# Patient Record
Sex: Male | Born: 1950 | Race: White | Hispanic: No | Marital: Married | Smoking: Never smoker
Health system: Southern US, Community
[De-identification: ages and names within clinical notes are randomized; demographics above are authoritative.]

## PROBLEM LIST (undated history)

## (undated) DIAGNOSIS — I2510 Atherosclerotic heart disease of native coronary artery without angina pectoris: Secondary | ICD-10-CM

## (undated) DIAGNOSIS — M1990 Unspecified osteoarthritis, unspecified site: Secondary | ICD-10-CM

## (undated) DIAGNOSIS — E785 Hyperlipidemia, unspecified: Secondary | ICD-10-CM

## (undated) DIAGNOSIS — I509 Heart failure, unspecified: Secondary | ICD-10-CM

## (undated) DIAGNOSIS — C4491 Basal cell carcinoma of skin, unspecified: Secondary | ICD-10-CM

## (undated) DIAGNOSIS — H9190 Unspecified hearing loss, unspecified ear: Secondary | ICD-10-CM

## (undated) DIAGNOSIS — Z87442 Personal history of urinary calculi: Secondary | ICD-10-CM

## (undated) DIAGNOSIS — I10 Essential (primary) hypertension: Secondary | ICD-10-CM

## (undated) DIAGNOSIS — J309 Allergic rhinitis, unspecified: Secondary | ICD-10-CM

## (undated) HISTORY — PX: COLONOSCOPY: SHX174

## (undated) HISTORY — DX: Heart failure, unspecified: I50.9

## (undated) HISTORY — PX: CARDIAC CATHETERIZATION: SHX172

## (undated) HISTORY — DX: Unspecified hearing loss, unspecified ear: H91.90

## (undated) HISTORY — PX: NASAL SEPTUM SURGERY: SHX37

## (undated) HISTORY — PX: VASECTOMY: SHX75

## (undated) HISTORY — DX: Allergic rhinitis, unspecified: J30.9

## (undated) HISTORY — DX: Basal cell carcinoma of skin, unspecified: C44.91

---

## 1998-07-24 DIAGNOSIS — C4491 Basal cell carcinoma of skin, unspecified: Secondary | ICD-10-CM

## 1998-07-24 HISTORY — DX: Basal cell carcinoma of skin, unspecified: C44.91

## 2004-03-04 ENCOUNTER — Emergency Department (HOSPITAL_COMMUNITY): Admission: EM | Admit: 2004-03-04 | Discharge: 2004-03-05 | Payer: Self-pay | Admitting: Emergency Medicine

## 2004-03-06 ENCOUNTER — Emergency Department (HOSPITAL_COMMUNITY): Admission: EM | Admit: 2004-03-06 | Discharge: 2004-03-06 | Payer: Self-pay | Admitting: Emergency Medicine

## 2006-05-23 IMAGING — CT CT ABDOMEN W/O CM
1 series · 16 of 32 positions shown, 20 images · non-contrast
Comparison: none

CLINICAL DATA: Left lower abdominal pain.  Can?t urinate.
 CT ABDOMEN WITHOUT CONTRAST AND CT PELVIS WITHOUT CONTRAST ? 03/04/04
TECHNIQUE: 5mm collimated images were obtained without oral or intravenous contrast.  
 CT ABDOMEN:
 Unenhanced appearance of the liver, spleen, pancreas, adrenal glands, and gallbladder, as well as visualized portions of the small and large bowel all appear unremarkable.  
 The right kidney is normal without stones, hydronephrosis, or obvious mass.  The left kidney is moderately hydronephrotic as is the left ureter.  No aortic or inferior vena cava abnormalities are seen.  There is no obvious adenopathy.
 IMPRESSION
 Obstructive uropathy of left renal system.
 CT PELVIS:
 Vascular calcification is noted.  Left hydroureter is again noted.  There is a 2 mm calculus lodged at the left ureterovesical junction.  A Foley catheter decompresses the bladder.  Small and large bowel are unremarkable.  There is no free fluid in the pelvis.
 Obstructive uropathy secondary to a 2 mm left ureterovesical junction stone.

[Series 3: — · axial · 0.71mm/px · z∈[-413,-81]mm · 16 of 92 slices shown, 20 images]
[im 6/92  soft-tissue]
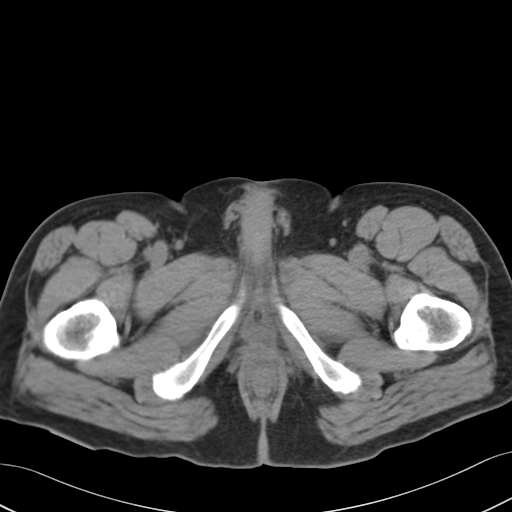
[im 6/92  bone]
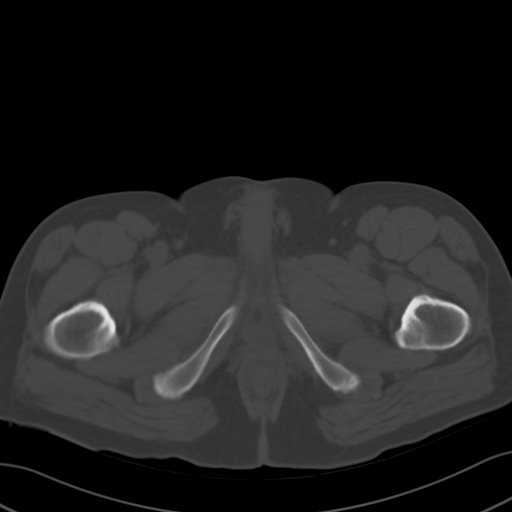
[im 12/92  soft-tissue]
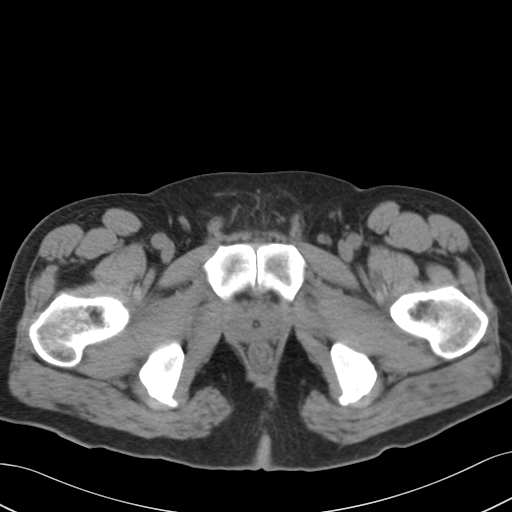
[im 18/92  soft-tissue]
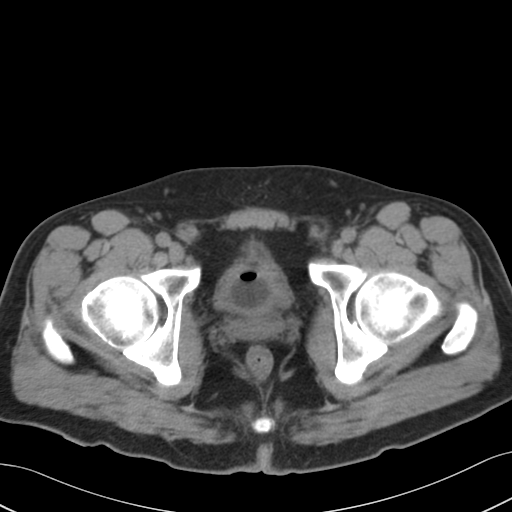
[im 24/92  soft-tissue]
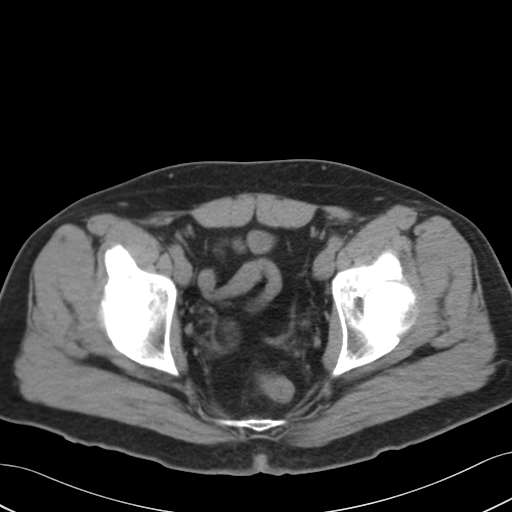
[im 30/92  soft-tissue]
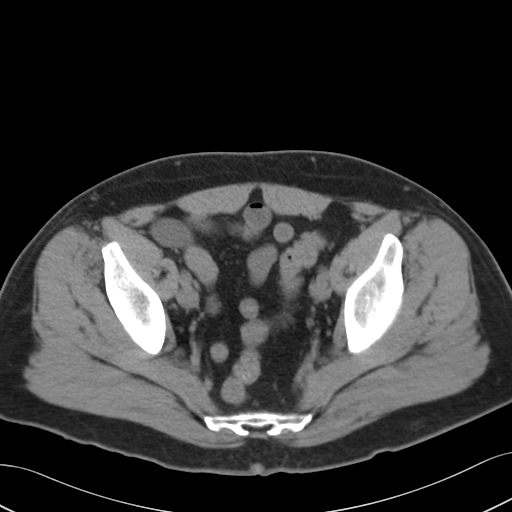
[im 36/92  soft-tissue]
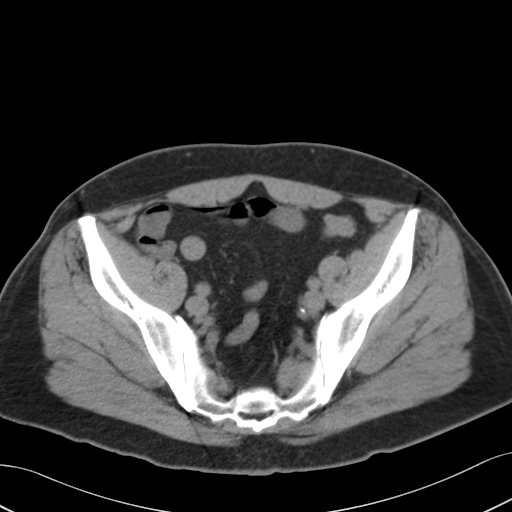
[im 42/92  soft-tissue]
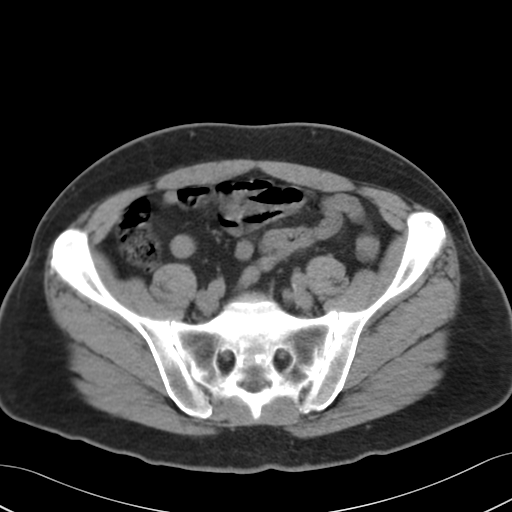
[im 50/92  soft-tissue]
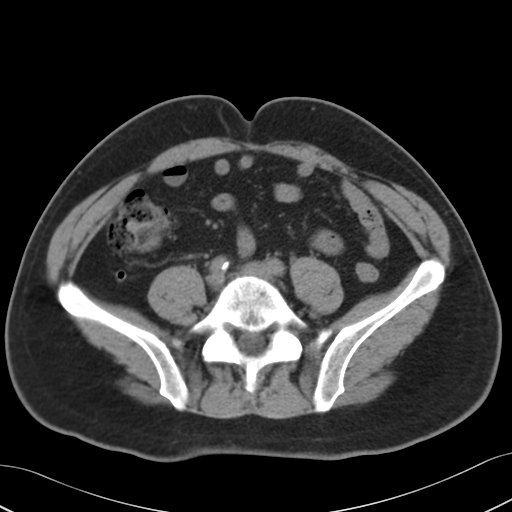
[im 56/92  soft-tissue]
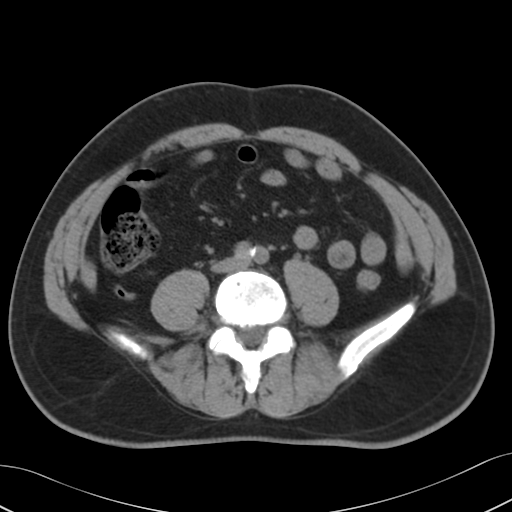
[im 56/92  bone]
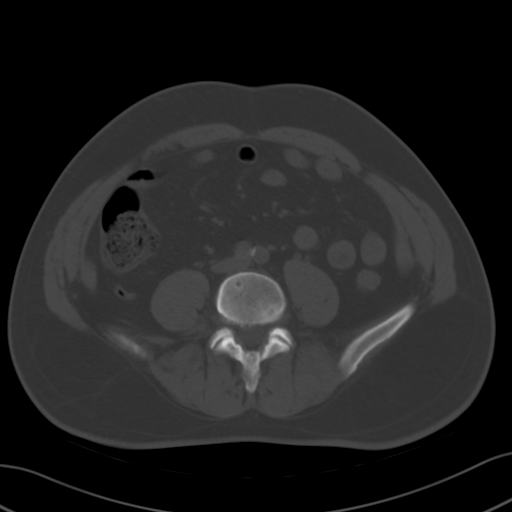
[im 62/92  soft-tissue]
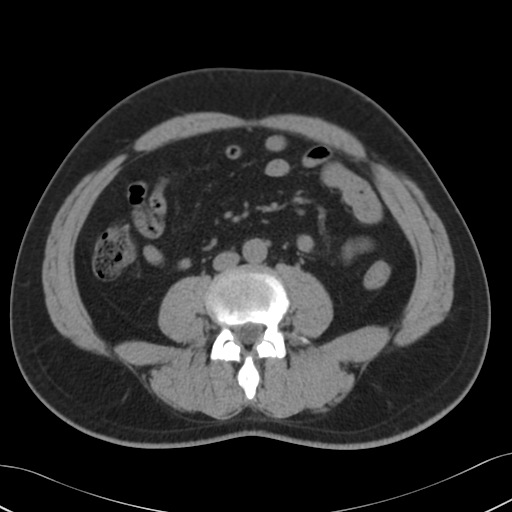
[im 68/92  soft-tissue]
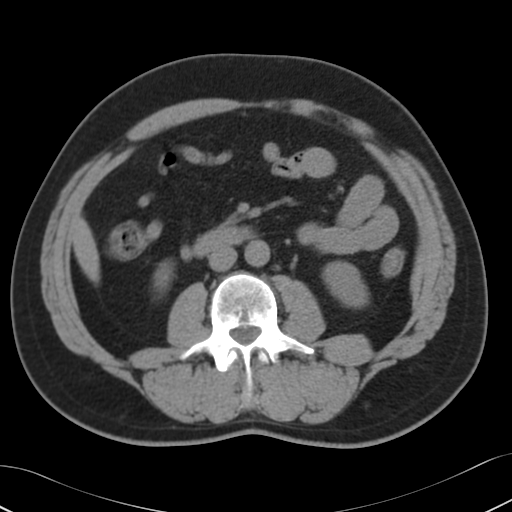
[im 74/92  soft-tissue]
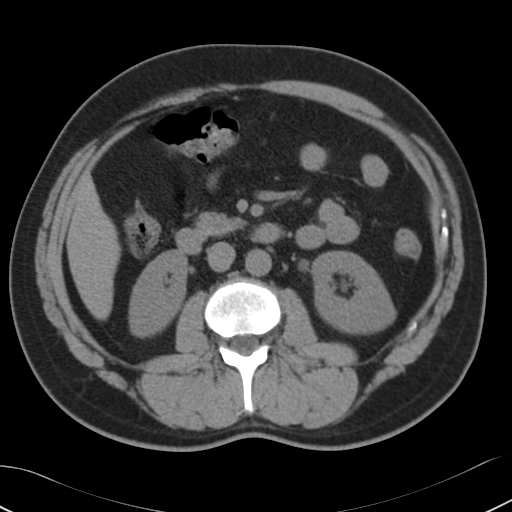
[im 80/92  soft-tissue]
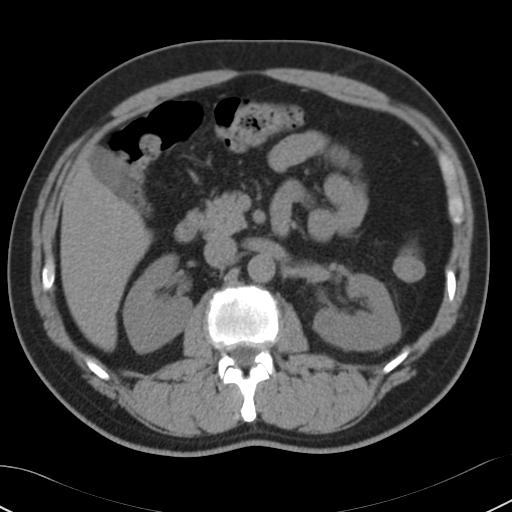
[im 80/92  lung]
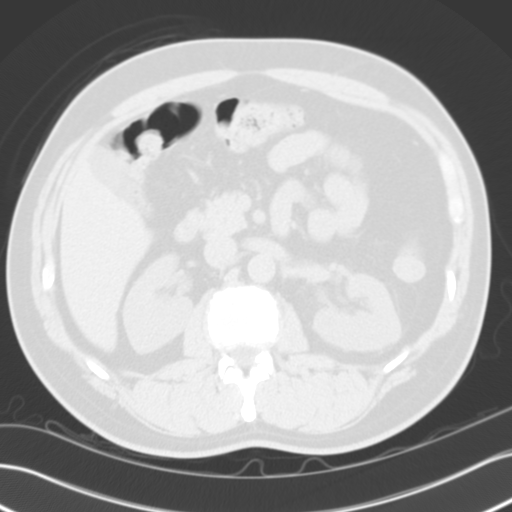
[im 83/92  lung]
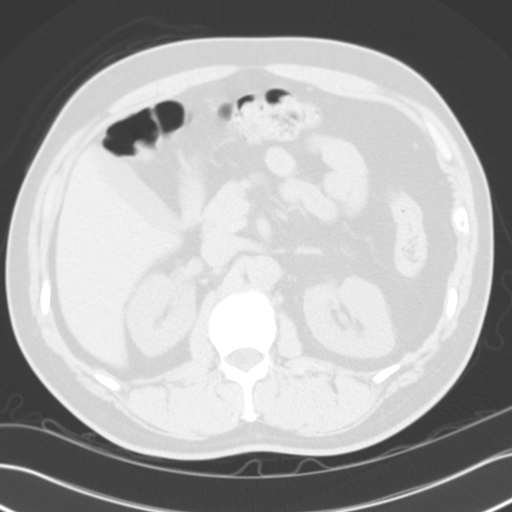
[im 86/92  soft-tissue]
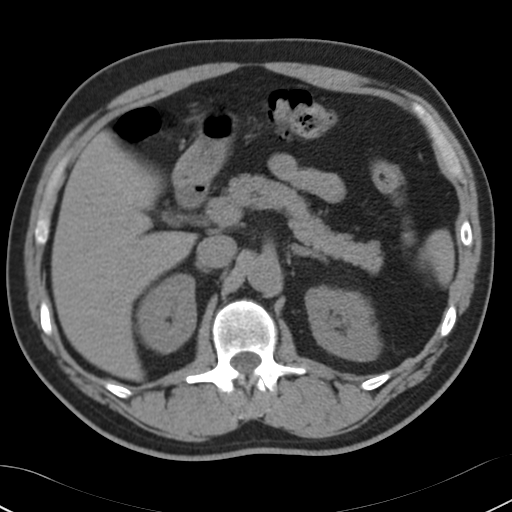
[im 86/92  lung]
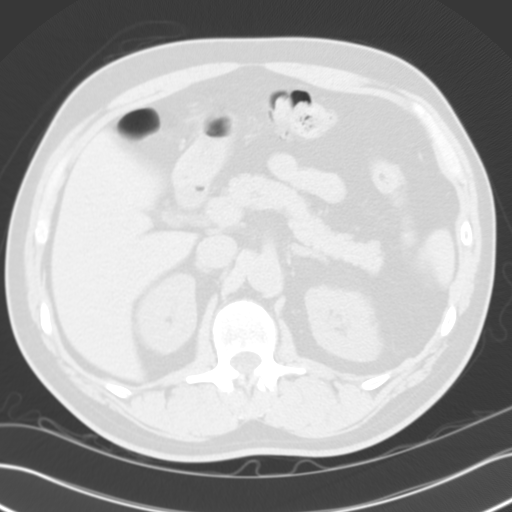
[im 89/92  lung]
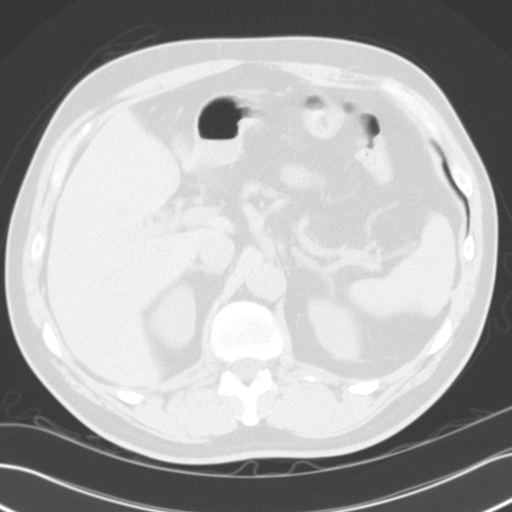

[16 of 32 positions shown; findings below may reference images not displayed]

## 2014-01-14 ENCOUNTER — Ambulatory Visit
Admission: RE | Admit: 2014-01-14 | Discharge: 2014-01-14 | Disposition: A | Discharge status: Home / Self Care | Payer: BC Managed Care – PPO | Source: Ambulatory Visit | Attending: Physician Assistant | Admitting: Physician Assistant

## 2014-01-14 ENCOUNTER — Other Ambulatory Visit: Payer: Self-pay | Admitting: Physician Assistant

## 2014-01-14 DIAGNOSIS — R079 Chest pain, unspecified: Secondary | ICD-10-CM

## 2014-01-21 ENCOUNTER — Encounter (HOSPITAL_COMMUNITY): Payer: Self-pay | Admitting: Pharmacy Technician

## 2014-01-21 ENCOUNTER — Other Ambulatory Visit: Payer: Self-pay | Admitting: Cardiology

## 2014-01-21 NOTE — H&P (Signed)
Alex Herrera    Date of visit:  01/16/2014 DOB:  [birth year]    Age:  63 yrs. Medical record number:  [id number]     Account number:  [id number] Primary Care Provider: Maury Dus ____________________________ CURRENT DIAGNOSES  1. Chest Pain  2. Hyperlipidemia  3. Hypertension,Essential (Benign)  4. Obesity(BMI30-40) ____________________________ ALLERGIES  Amlodipine, Edema  Penicillins, Intolerance-unknown ____________________________ MEDICATIONS  1. valacyclovir 1 gram tablet, 1 p.o. daily  2. Edarbi 80 mg tablet, 1 p.o. daily  3. hydrochlorothiazide 25 mg tablet, 1/2 tab daily  4. pravastatin 40 mg tablet, 1 p.o. daily  5. aspirin 81 mg chewable tablet, 1 p.o. daily  6. Flonase Allergy Relief 50 mcg/actuation nasal spray,suspension, qd  7. Benadryl 25 mg capsule, PRN  8. Aleve PM 220 mg-25 mg tablet, QHS  9. metoprolol succinate ER 25 mg tablet,extended release 24 hr, 1 p.o. daily  10. nitroglycerin 0.4 mg sublingual tablet, PRN ____________________________ CHIEF COMPLAINTS  Chest pain with exertion ____________________________ HISTORY OF PRESENT ILLNESS  This very nice 63 year old male is evaluated for the onset of exertional chest tightness. He has a prior history of hypertension as well as hyperlipidemia and mild obesity. He went on a trip to [place] and then after coming back on developed some substernal pressure and tightness when he attempted to walk on a treadmill. He has had midsternal tightness and heaviness with activity since then on several occasions and had an episode earlier in the week where he had similar discomfort when attempting sexual relations with his wife. He was concerned and saw his primary physician and was seen today for an exercise treadmill. He had an abnormal treadmill test at Bruce stage III with 1 mm of ST depression in the anterior leads with T wave changes noted in recovery. He has not had any rest angina. He denies PND, orthopnea or  edema ____________________________ PAST HISTORY  Past Medical Illnesses:  hypertension, hyperlipidemia, kidney stones, carpal tunnel syndrome;  Cardiovascular Illnesses:  no previous history of cardiac disease.;  Surgical Procedures:  nose surg;  Cardiology Procedures-Invasive:  no history of prior cardiac procedures;  Cardiology Procedures-Noninvasive:  treadmill June 2015;  LVEF not documented,   ____________________________ CARDIO-PULMONARY TEST DATES EKG Date:  01/16/2014;   ____________________________ FAMILY HISTORY Brother -- Brother alive with problem Brother -- Brother dead, AIDS Father -- Father dead, Hypertension, Chronic obstructive lung disease Mother -- Mother dead, Coronary artery bypass grafting, Diabetes mellitus, Dementia/Alzheimers Sister -- Sister alive with problem, Hypertension ____________________________ SOCIAL HISTORY Alcohol Use:  rarely;  Smoking:  never smoked;  Diet:  regular diet;  Lifestyle:  married;  Exercise:  walking 2 days per week;  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 Eyes: denies diplopia, history of glaucoma or visual problems. Ears, Nose, Throat, Mouth:  denies any hearing loss, epistaxis, hoarseness or difficulty speaking. Respiratory: denies dyspnea, cough, wheezing or hemoptysis. Cardiovascular:  please review HPI Abdominal: denies dyspepsia, GI bleeding, constipation, or diarrhea Genitourinary-Male: no dysuria, urgency, frequency, or nocturia  Musculoskeletal:  denies arthritis, venous insufficiency, or muscle weakness Neurological:  occasional migraine headaches Psychiatric:  denies depession or anxiety  ____________________________ PHYSICAL EXAMINATION VITAL SIGNS  Blood Pressure:  120/70 Sitting, Left arm, regular cuff   Pulse:  68/min. Weight:  202.00 lbs. Height:  70"BMI:  29  Constitutional:  pleasant white male in no acute distress, mildly obese  Skin:  warm and dry to touch, no apparent skin lesions, or masses noted. Head:  normocephalic, normal hair pattern, no masses or tenderness Eyes:  EOMS Intact, PERRLA, C and S clear, Funduscopic exam not done. ENT:  ears, nose and throat reveal no gross abnormalities.  Dentition good. Neck:  supple, without massess. No JVD, thyromegaly or carotid bruits. Carotid upstroke normal. Chest:  normal symmetry, clear to auscultation. Cardiac:  regular rhythm, normal S1 and S2, No S3 or S4, no murmurs, gallops or rubs detected. Abdomen:  abdomen soft,non-tender, no masses, no hepatospenomegaly, or aneurysm noted Peripheral Pulses:  the femoral,and posterior tibial pulses are full and equal bilaterally with no bruits auscultated.dorsalis pedis diminished. Extremities & Back:  no deformities, clubbing, cyanosis, erythema or edema observed. Normal muscle strength and tone. Neurological:  no gross motor or sensory deficits noted, affect appropriate, oriented x3. ____________________________ IMPRESSIONS/PLAN 1. Exertional chest discomfort suggestive of new onset angina with an abnormal exercise test 2. Hypertension controlled 3. Hyperlipidemia under treatment 4. Overweight  Recommendations:  Plan cardiac catheterization to assess for extent of coronary artery disease.Cardiac catheterization was discussed with the patient including risks of myocardial infarction, death, stroke, bleeding, arrhythmia, dye allergy, or renal insufficiency. He understands and is willing to proceed. Possibility of percutaneous intervention at the same setting was also discussed with the patient including risks. Begun on metoprolol and given NTG prn. ____________________________ TODAYS ORDERS  1. Comprehensive Metabolic Panel: Today  2. Complete Blood Count: Today  3. Draw PT/INR: Today  4. PTT: Today  5. Left Heart Cath/Poss PCI: 1 week                        ____________________________ Cardiology Physician:  Kerry Hough MD [facility]

## 2014-01-22 ENCOUNTER — Encounter (HOSPITAL_COMMUNITY): Payer: Self-pay | Admitting: General Practice

## 2014-01-22 ENCOUNTER — Encounter (HOSPITAL_COMMUNITY)
Admission: RE | Disposition: A | Discharge status: Home / Self Care | Payer: BC Managed Care – PPO | Source: Ambulatory Visit | Attending: Cardiology

## 2014-01-22 ENCOUNTER — Ambulatory Visit (HOSPITAL_COMMUNITY)
Admission: RE | Admit: 2014-01-22 | Discharge: 2014-01-23 | Disposition: A | Discharge status: Home / Self Care | Payer: BC Managed Care – PPO | Source: Ambulatory Visit | Attending: Cardiology | Admitting: Cardiology

## 2014-01-22 DIAGNOSIS — R9439 Abnormal result of other cardiovascular function study: Secondary | ICD-10-CM | POA: Diagnosis present

## 2014-01-22 DIAGNOSIS — E669 Obesity, unspecified: Secondary | ICD-10-CM | POA: Insufficient documentation

## 2014-01-22 DIAGNOSIS — I2510 Atherosclerotic heart disease of native coronary artery without angina pectoris: Secondary | ICD-10-CM

## 2014-01-22 DIAGNOSIS — I10 Essential (primary) hypertension: Secondary | ICD-10-CM | POA: Insufficient documentation

## 2014-01-22 DIAGNOSIS — I25119 Atherosclerotic heart disease of native coronary artery with unspecified angina pectoris: Secondary | ICD-10-CM

## 2014-01-22 DIAGNOSIS — Z955 Presence of coronary angioplasty implant and graft: Secondary | ICD-10-CM

## 2014-01-22 DIAGNOSIS — I2102 ST elevation (STEMI) myocardial infarction involving left anterior descending coronary artery: Secondary | ICD-10-CM | POA: Diagnosis present

## 2014-01-22 DIAGNOSIS — I252 Old myocardial infarction: Secondary | ICD-10-CM | POA: Diagnosis present

## 2014-01-22 DIAGNOSIS — Z6829 Body mass index (BMI) 29.0-29.9, adult: Secondary | ICD-10-CM | POA: Insufficient documentation

## 2014-01-22 DIAGNOSIS — Z9861 Coronary angioplasty status: Secondary | ICD-10-CM

## 2014-01-22 DIAGNOSIS — Z7982 Long term (current) use of aspirin: Secondary | ICD-10-CM | POA: Insufficient documentation

## 2014-01-22 DIAGNOSIS — I200 Unstable angina: Secondary | ICD-10-CM | POA: Insufficient documentation

## 2014-01-22 DIAGNOSIS — E785 Hyperlipidemia, unspecified: Secondary | ICD-10-CM | POA: Insufficient documentation

## 2014-01-22 HISTORY — PX: PTCA: SHX146

## 2014-01-22 HISTORY — DX: Atherosclerotic heart disease of native coronary artery without angina pectoris: I25.10

## 2014-01-22 HISTORY — DX: Unspecified osteoarthritis, unspecified site: M19.90

## 2014-01-22 HISTORY — PX: LEFT HEART CATHETERIZATION WITH CORONARY ANGIOGRAM: SHX5451

## 2014-01-22 HISTORY — DX: Personal history of urinary calculi: Z87.442

## 2014-01-22 HISTORY — DX: Essential (primary) hypertension: I10

## 2014-01-22 HISTORY — DX: Hyperlipidemia, unspecified: E78.5

## 2014-01-22 HISTORY — PX: PERCUTANEOUS CORONARY STENT INTERVENTION (PCI-S): SHX5485

## 2014-01-22 LAB — POCT ACTIVATED CLOTTING TIME
ACTIVATED CLOTTING TIME: 146 s
ACTIVATED CLOTTING TIME: 168 s
ACTIVATED CLOTTING TIME: 146 s
Activated Clotting Time: 439 seconds

## 2014-01-22 SURGERY — LEFT HEART CATHETERIZATION WITH CORONARY ANGIOGRAM
Anesthesia: LOCAL

## 2014-01-22 MED ORDER — SODIUM CHLORIDE 0.9 % IJ SOLN: 3.0000 mL | INTRAMUSCULAR | Status: DC | PRN

## 2014-01-22 MED ORDER — CLOPIDOGREL BISULFATE 300 MG PO TABS
ORAL_TABLET | ORAL | Status: AC
  Filled 2014-01-22: qty 2

## 2014-01-22 MED ORDER — MIDAZOLAM HCL 2 MG/2ML IJ SOLN
INTRAMUSCULAR | Status: AC
  Filled 2014-01-22: qty 2

## 2014-01-22 MED ORDER — SODIUM CHLORIDE 0.9 % IV SOLN: INTRAVENOUS | Status: AC

## 2014-01-22 MED ORDER — SIMVASTATIN 20 MG PO TABS
20.0000 mg | ORAL_TABLET | Freq: Every day | ORAL | Status: DC
  Filled 2014-01-22: qty 1

## 2014-01-22 MED ORDER — HEPARIN (PORCINE) IN NACL 2-0.9 UNIT/ML-% IJ SOLN
INTRAMUSCULAR | Status: AC
  Filled 2014-01-22: qty 500

## 2014-01-22 MED ORDER — CLOPIDOGREL BISULFATE 75 MG PO TABS
75.0000 mg | ORAL_TABLET | Freq: Every day | ORAL | Status: DC
  Administered 2014-01-23: 75 mg via ORAL
  Filled 2014-01-22: qty 1

## 2014-01-22 MED ORDER — FLUTICASONE PROPIONATE 50 MCG/ACT NA SUSP: 2.0000 | Freq: Every day | NASAL | Status: DC | PRN

## 2014-01-22 MED ORDER — ASPIRIN 81 MG PO CHEW
CHEWABLE_TABLET | ORAL | Status: AC
  Administered 2014-01-22: 81 mg via ORAL
  Filled 2014-01-22: qty 1

## 2014-01-22 MED ORDER — ASPIRIN EC 81 MG PO TBEC
81.0000 mg | DELAYED_RELEASE_TABLET | Freq: Every morning | ORAL | Status: DC
  Administered 2014-01-23: 81 mg via ORAL
  Filled 2014-01-22: qty 1

## 2014-01-22 MED ORDER — SODIUM CHLORIDE 0.9 % IJ SOLN: 3.0000 mL | Freq: Two times a day (BID) | INTRAMUSCULAR | Status: DC

## 2014-01-22 MED ORDER — BIVALIRUDIN 250 MG IV SOLR
INTRAVENOUS | Status: AC
  Filled 2014-01-22: qty 250

## 2014-01-22 MED ORDER — LIDOCAINE HCL (PF) 1 % IJ SOLN
INTRAMUSCULAR | Status: AC
  Filled 2014-01-22: qty 30

## 2014-01-22 MED ORDER — FENTANYL CITRATE 0.05 MG/ML IJ SOLN
INTRAMUSCULAR | Status: AC
  Filled 2014-01-22: qty 2

## 2014-01-22 MED ORDER — ACETAMINOPHEN 325 MG PO TABS: 650.0000 mg | ORAL_TABLET | ORAL | Status: DC | PRN

## 2014-01-22 MED ORDER — HYDROCHLOROTHIAZIDE 25 MG PO TABS
12.5000 mg | ORAL_TABLET | Freq: Every morning | ORAL | Status: DC
  Filled 2014-01-22: qty 0.5

## 2014-01-22 MED ORDER — ASPIRIN 81 MG PO CHEW
CHEWABLE_TABLET | ORAL | Status: AC
  Filled 2014-01-22: qty 3

## 2014-01-22 MED ORDER — SODIUM CHLORIDE 0.9 % IV SOLN: 250.0000 mL | INTRAVENOUS | Status: DC | PRN

## 2014-01-22 MED ORDER — IRBESARTAN 300 MG PO TABS
300.0000 mg | ORAL_TABLET | Freq: Every day | ORAL | Status: DC
  Administered 2014-01-23: 09:00:00 300 mg via ORAL
  Filled 2014-01-22: qty 1

## 2014-01-22 MED ORDER — ONDANSETRON HCL 4 MG/2ML IJ SOLN: 4.0000 mg | Freq: Four times a day (QID) | INTRAMUSCULAR | Status: DC | PRN

## 2014-01-22 MED ORDER — METOPROLOL SUCCINATE ER 25 MG PO TB24
25.0000 mg | ORAL_TABLET | Freq: Every morning | ORAL | Status: DC
  Administered 2014-01-23: 09:00:00 25 mg via ORAL
  Filled 2014-01-22: qty 1

## 2014-01-22 MED ORDER — ASPIRIN 81 MG PO CHEW
81.0000 mg | CHEWABLE_TABLET | ORAL | Status: AC
  Administered 2014-01-22: 81 mg via ORAL

## 2014-01-22 MED ORDER — ADULT MULTIVITAMIN W/MINERALS CH
1.0000 | ORAL_TABLET | Freq: Every day | ORAL | Status: DC
  Administered 2014-01-23: 09:00:00 1 via ORAL
  Filled 2014-01-22: qty 1

## 2014-01-22 MED ORDER — HEPARIN (PORCINE) IN NACL 2-0.9 UNIT/ML-% IJ SOLN
INTRAMUSCULAR | Status: AC
  Filled 2014-01-22: qty 1000

## 2014-01-22 MED ORDER — VALACYCLOVIR HCL 500 MG PO TABS
1000.0000 mg | ORAL_TABLET | Freq: Every morning | ORAL | Status: DC
  Administered 2014-01-23: 09:00:00 1000 mg via ORAL
  Filled 2014-01-22: qty 2

## 2014-01-22 MED ORDER — CLOPIDOGREL BISULFATE 75 MG PO TABS: 75.0000 mg | ORAL_TABLET | Freq: Every day | ORAL | Status: DC

## 2014-01-22 MED ORDER — SODIUM CHLORIDE 0.9 % IV SOLN: INTRAVENOUS | Status: DC

## 2014-01-22 MED ORDER — ROSUVASTATIN CALCIUM 20 MG PO TABS
20.0000 mg | ORAL_TABLET | Freq: Every day | ORAL | Status: DC
Start: 2014-01-22 — End: 2014-01-23

## 2014-01-22 NOTE — CV Procedure (Signed)
Cardiac Catheterization Report   Milen Lengacher    63 y.o.  male  DOB: 12-06-1950  MRN: [id number]  01/22/2014    PROCEDURE:  Left heart catheterization with selective coronary angiography, left ventriculogram.  INDICATIONS:  Chest pain with an abnormal exercise test The risks, benefits, and details of the procedure were explained to the patient.  The patient verbalized understanding and wanted to proceed.  Informed written consent was obtained.  PROCEDURE TECHNIQUE:  After Xylocaine anesthesia a 62F sheath was placed in the right femoral artery with a single anterior needle wall stick.   Left coronary angiography was done using a Judkins L4 guide catheter.  Right coronary angiography was done using a Judkins R4 guide catheter.  A 30 cc ventriculogram was performed in the 30 degree RAO projection.  Tolerated the procedure well.  The patient was left on the table in the films were reviewed with interventional cardiologist who will plan PCI of the right coronary artery.   CONTRAST:  Total of 60 cc.  COMPLICATIONS:  None.    HEMODYNAMICS: Aortic postcontrast 155/8, left ventricle postcontrast 155/4016.  There was no gradient between the left ventricle and aorta.    ANGIOGRAPHIC DATA:    CORONARY ARTERIES:   Arise and distribute normally.  Codominant. Calcification is seen involving the left anterior descending proximally and midportion.  Left main coronary artery: Normal.  Left anterior descending: Calcified proximally, mild irregularity proximally with a 40% stenosis prior to the septal perforator Diagonal branch has a 30% ostial stenosis. The coronaries are somewhat small in caliber.  Circumflex coronary artery: Mild irregularity  Right coronary artery: Severe 99% proximal stenosis , the artery ends in a posterior descending artery.   LEFT VENTRICULOGRAM:  Performed in the 30 RAO projection.  The aortic and mitral valves are normal. The left  ventricle is normal in size with normal wall motion. Estimated ejection fraction is  50%.   IMPRESSIONS:  1. Coronary artery disease with a severe proximal stenosis involving the right coronary artery, moderate stenosis involving the LAD  2. Llower limits of normal left ventricular function   RECOMMENDATION:   Dr. Julianne Handler will evaluate the patient for consideration of coronary stenting. The patient will be left on the table.    Kerry Hough MD [facility]

## 2014-01-22 NOTE — Care Management Note (Addendum)
  Page 1 of 1   01/22/2014     12:41:59 PM CARE MANAGEMENT NOTE 01/22/2014  Patient:  Alex Herrera, Alex Herrera   Account Number:  [id number]  Date Initiated:  01/22/2014  Documentation initiated by:  Mariann Laster  Subjective/Objective Assessment:   chest pain  Left Heart Cath 01/22/2014     Action/Plan:   CM to follow for dispositon needs   Anticipated DC Date:  01/23/2014   Anticipated DC Plan:  HOME/SELF CARE         Choice offered to / List presented to:             Status of service:  Completed, signed off Medicare Important Message given?   (If response is "NO", the following Medicare IM given date fields will be blank) Date Medicare IM given:   Medicare IM given by:   Date Additional Medicare IM given:   Additional Medicare IM given by:    Discharge Disposition:  HOME/SELF CARE  Per UR Regulation:    If discussed at Long Length of Stay Meetings, dates discussed:    Comments:  Jaman Aro RN, BSN, MSHL, CCM  Nurse - Alex Herrera Manager, (Unit [phone]  01/22/2014 Med Review:  Plavix

## 2014-01-22 NOTE — Progress Notes (Signed)
Site area: right groin  Site Prior to Removal:  Level 0  Pressure Applied For 20 MINUTES    Minutes Beginning at 1155  Manual:   Yes.    Patient Status During Pull:  stable  Post Pull Groin Site:  Level 0  Post Pull Instructions Given:  Yes.    Post Pull Pulses Present:  Yes.    Dressing Applied:  Yes.    Comments:   

## 2014-01-22 NOTE — Interval H&P Note (Signed)
History and Physical Interval Note:  01/22/2014 7:41 AM     Patient seen and examined.  No interval change in history and exam since last note.  Stable for procedure.  Kerry Hough. MD [facility]  01/22/2014

## 2014-01-22 NOTE — Discharge Summary (Signed)
Physician Discharge Summary  Patient ID: Alex Herrera MRN: [id number] DOB/AGE: May 16, 1951 63 y.o.  Admit date: 01/22/2014 Discharge date: 01/22/2014  Primary Physician:  Dr. Maury Dus  Primary Discharge Diagnosis:  1. Coronary artery disease with recent onset angina  Secondary Discharge Diagnosis: 2. Hypertension 3. Obesity 4. Hyperlipidemia  Procedures:  Cardiac catheterization, percutaneous intervention with drug-eluting stent placement to the right coronary artery  Hospital Course: This 63 year old male had the onset of angina about 3-4 weeks prior to admission. The discomfort occurred with exercise as well as sexual intercourse and he was seen at the office for he had an abnormal exercise treadmill test into Bruce stage III with 2 mm of ST depression in the lateral leads. Was started on metoprolol and aspirin and given nitroglycerin to use.  He was brought in for same-day cardiac catheterization which showed lower limits of normal left ventricular function with an EF of 50%. There is calcification in the LAD with a 40% proximal LAD stenosis and some diffuse disease in the diagonal and LAD, irregularities the circumflex, and a 99% segmental stenosis in the proximal right coronary artery. He underwent stenting by Dr. Julianne Handler with a Xience 2.5 x 15 mm  drug-eluting stent post dilated to 2.75 mm. He tolerated this well was ambulatory in the hall and seen by cardiac rehabilitation.  He'll be changed to a high intensity statin Crestor at discharge and is instructed to take Plavix and aspirin for least a year and not discontinue it.  Discharge Exam: Blood pressure 118/74, pulse 55, temperature 98 F (36.7 C), temperature source Oral, resp. rate 18, height 5\' 10"  (1.778 m), weight 92.534 kg (204 lb), SpO2 94.00%. Weight: 92.534 kg (204 lb)  Lungs clear, catheterization site is clean and dry.  Discharge Medications:   Medication List    STOP taking these medications       pravastatin 40 MG tablet  Commonly known as:  PRAVACHOL      TAKE these medications       aspirin EC 81 MG tablet  Take 81 mg by mouth every morning.     clopidogrel 75 MG tablet  Commonly known as:  PLAVIX  Take 1 tablet (75 mg total) by mouth daily with breakfast.  Start taking on:  01/23/2014     EDARBI 80 MG Tabs  Generic drug:  Azilsartan Medoxomil  Take 80 mg by mouth every morning.     fluticasone 50 MCG/ACT nasal spray  Commonly known as:  FLONASE  Place 2 sprays into both nostrils daily as needed (seasonal allergies).     hydrochlorothiazide 25 MG tablet  Commonly known as:  HYDRODIURIL  Take 12.5 mg by mouth every morning.     metoprolol succinate 25 MG 24 hr tablet  Commonly known as:  TOPROL-XL  Take 25 mg by mouth every morning.     multivitamin with minerals Tabs tablet  Take 1 tablet by mouth daily.     rosuvastatin 20 MG tablet  Commonly known as:  CRESTOR  Take 1 tablet (20 mg total) by mouth daily.     SUPER B COMPLEX PO  Take 2 tablets by mouth every morning.     valACYclovir 1000 MG tablet  Commonly known as:  VALTREX  Take 1,000 mg by mouth every morning.       Followup plans and appointments: Followup with Dr. Wynonia Lawman one week  Time spent with patient to include physician time:  30 minutes  Signed: W. Doristine Church. MD [facility] 01/23/2014,  8:21 AM

## 2014-01-22 NOTE — CV Procedure (Signed)
     Cardiac Catheterization Operative Report  Alex Herrera [id number] 7/2/20159:19 AM No primary provider on file.  Procedure Performed:  1. PTCA/DES x 1 proximal RCA    Operator: Lauree Chandler, MD  Indication:  Unstable angina (class III). Diagnostic cath per Dr. Wynonia Lawman with severe proximal RCA stenosis.                              Procedure Details: The risks, benefits, complications, treatment options, and expected outcomes were discussed with the patient. The patient and/or family concurred with the proposed plan, giving informed consent. When I entered the case there was a 6 [place] sheath present in the right femoral artery. He was given Angiomax bolus and drip was started. Plavix 600 mg po x 1. I then engaged the RCA with a JR4 guide. A cougar IC wire was advanced down the RCA. A 2.0 x 12 mm balloon was used to pre-dilate the stenosis. A 2.5 x 15 mm Xience DES was deployed in the proximal RCA. A 2.75 x 12 mm [place] balloon was used to post-dilate the stent. The stenosis was taken from 99% down to 0%. There were no immediate complications. The patient was taken to the recovery area in stable condition. Right femoral artery angiogram demonstrated sheath entry just below the inferior epigastric artery, not suitable for closure device.   Impression: 1. Successful PTCA/DES x 1 proximal RCA  Recommendations: He will be continued on ASA and Plavix for one year. Continue statin and beta blocker.        Complications:  None; patient tolerated the procedure well.

## 2014-01-22 NOTE — Progress Notes (Signed)
CARDIAC REHAB PHASE I   Pt unable to walk at this time due to bed rest from earlier cath.  Reviewed pt education with wife and pt.  Pt is interested in CRP II in [place].  Pt and wife did not have any questions at this time. 1412  Lillia Dallas MS, ACSM RCEP 2:09 PM 01/22/2014

## 2014-01-23 LAB — BASIC METABOLIC PANEL
ANION GAP: 11 (ref 5–15)
BUN: 16 mg/dL (ref 6–23)
CHLORIDE: 99 meq/L (ref 96–112)
CO2: 28 mEq/L (ref 19–32)
Calcium: 8.9 mg/dL (ref 8.4–10.5)
Creatinine, Ser: 1.25 mg/dL (ref 0.50–1.35)
GFR, EST AFRICAN AMERICAN: 69 mL/min — AB (ref >90)
GFR, EST NON AFRICAN AMERICAN: 59 mL/min — AB (ref >90)
Glucose, Bld: 100 mg/dL — ABNORMAL HIGH (ref 70–99)
POTASSIUM: 4.6 meq/L (ref 3.7–5.3)
SODIUM: 138 meq/L (ref 137–147)

## 2014-01-23 LAB — CBC
HCT: 44.5 % (ref 39.0–52.0)
Hemoglobin: 14.8 g/dL (ref 13.0–17.0)
MCH: 30.9 pg (ref 26.0–34.0)
MCHC: 33.3 g/dL (ref 30.0–36.0)
MCV: 92.9 fL (ref 78.0–100.0)
Platelets: 222 K/uL (ref 150–400)
RBC: 4.79 MIL/uL (ref 4.22–5.81)
RDW: 13.4 % (ref 11.5–15.5)
WBC: 6.9 K/uL (ref 4.0–10.5)

## 2014-01-23 MED ORDER — CLOPIDOGREL BISULFATE 75 MG PO TABS: 75.0000 mg | ORAL_TABLET | Freq: Every day | ORAL | Status: DC

## 2014-01-23 MED ORDER — HYDROCHLOROTHIAZIDE 12.5 MG PO CAPS
12.5000 mg | ORAL_CAPSULE | Freq: Every day | ORAL | Status: DC
  Administered 2014-01-23: 09:00:00 12.5 mg via ORAL
  Filled 2014-01-23: qty 1

## 2014-01-23 MED ORDER — ROSUVASTATIN CALCIUM 20 MG PO TABS: 20.0000 mg | ORAL_TABLET | Freq: Every day | ORAL | Status: DC

## 2014-01-23 MED FILL — Sodium Chloride IV Soln 0.9%: INTRAVENOUS | Qty: 50 | Status: AC

## 2014-01-23 NOTE — Discharge Instructions (Addendum)
Be sure that you take your Plavix and aspirin every day. Do not stop taking them without consulting your cardiologist.  When taking long trips, be sure to get out and walk every one to 2 hours.  Get a copy of the book, the [employer] and read it and begin.

## 2014-01-23 NOTE — Progress Notes (Signed)
Subjective:  Feels well overnight without chest pain. Cath site clean and dry  Objective:  Vital Signs in the last 24 hours: BP 128/90  Pulse 61  Temp(Src) 97.8 F (36.6 C) (Oral)  Resp 16  Ht 5\' 10"  (1.778 m)  Wt 92.4 kg (203 lb 11.3 oz)  BMI 29.23 kg/m2  SpO2 96%  Physical Exam:  Lungs:  Clear  Cardiac:  Regular rhythm, normal S1 and S2, no S3 Extremities:  Cath site clean and dry  Intake/Output from previous day: 07/02 0701 - 07/03 0700 In: 805 [P.O.:360; I.V.:445] Out: 925 [Urine:925] Weight Filed Weights   01/22/14 0547 01/23/14 0006  Weight: 92.534 kg (204 lb) 92.4 kg (203 lb 11.3 oz)    Lab Results: Basic Metabolic Panel:  Recent Labs  01/23/14 0340  NA 138  K 4.6  CL 99  CO2 28  GLUCOSE 100*  BUN 16  CREATININE 1.25    CBC:  Recent Labs  01/23/14 0340  WBC 6.9  HGB 14.8  HCT 44.5  MCV 92.9  PLT 222   Telemetry:  Sinus rhythm  Assessment/Plan:  1. Doing well following recent stent of the right coronary artery  Recommendations:  Plan discharge today. Instructions given. Followup in one week.     Kerry Hough  MD [facility]  01/23/2014, 8:19 AM

## 2014-02-13 ENCOUNTER — Encounter (HOSPITAL_COMMUNITY): Payer: BC Managed Care – PPO

## 2014-05-28 ENCOUNTER — Encounter: Payer: Self-pay | Admitting: Internal Medicine

## 2014-07-02 ENCOUNTER — Encounter (HOSPITAL_COMMUNITY): Payer: Self-pay | Admitting: Cardiology

## 2015-02-23 ENCOUNTER — Encounter: Payer: Self-pay | Admitting: Internal Medicine

## 2015-03-31 ENCOUNTER — Encounter: Payer: Self-pay | Admitting: Physician Assistant

## 2015-04-02 DIAGNOSIS — L237 Allergic contact dermatitis due to plants, except food: Secondary | ICD-10-CM | POA: Diagnosis not present

## 2015-04-15 DIAGNOSIS — L237 Allergic contact dermatitis due to plants, except food: Secondary | ICD-10-CM | POA: Diagnosis not present

## 2015-04-21 ENCOUNTER — Ambulatory Visit: Payer: BC Managed Care – PPO | Admitting: Physician Assistant

## 2015-05-05 ENCOUNTER — Encounter: Payer: BC Managed Care – PPO | Admitting: Internal Medicine

## 2015-05-06 DIAGNOSIS — G4700 Insomnia, unspecified: Secondary | ICD-10-CM | POA: Diagnosis not present

## 2015-05-06 DIAGNOSIS — I10 Essential (primary) hypertension: Secondary | ICD-10-CM | POA: Diagnosis not present

## 2015-05-06 DIAGNOSIS — Z1389 Encounter for screening for other disorder: Secondary | ICD-10-CM | POA: Diagnosis not present

## 2015-05-06 DIAGNOSIS — I2510 Atherosclerotic heart disease of native coronary artery without angina pectoris: Secondary | ICD-10-CM | POA: Diagnosis not present

## 2015-05-06 DIAGNOSIS — E786 Lipoprotein deficiency: Secondary | ICD-10-CM | POA: Diagnosis not present

## 2015-05-06 DIAGNOSIS — Z23 Encounter for immunization: Secondary | ICD-10-CM | POA: Diagnosis not present

## 2015-05-10 ENCOUNTER — Encounter: Payer: Self-pay | Admitting: *Deleted

## 2015-05-17 ENCOUNTER — Ambulatory Visit (INDEPENDENT_AMBULATORY_CARE_PROVIDER_SITE_OTHER): Payer: Medicare PPO | Admitting: Physician Assistant

## 2015-05-17 ENCOUNTER — Encounter: Payer: Self-pay | Admitting: Physician Assistant

## 2015-05-17 VITALS — BP 110/80 | HR 68 | Ht 70.0 in | Wt 199.8 lb

## 2015-05-17 DIAGNOSIS — Z1211 Encounter for screening for malignant neoplasm of colon: Secondary | ICD-10-CM | POA: Diagnosis not present

## 2015-05-17 DIAGNOSIS — Z7901 Long term (current) use of anticoagulants: Secondary | ICD-10-CM

## 2015-05-17 NOTE — Progress Notes (Signed)
Patient ID: Alex Herrera, male   DOB: 1951/07/12, 64 y.o.   MRN: [id number]   Subjective:    Patient ID: Alex Herrera, male    DOB: Apr 19, 1951, 64 y.o.   MRN: [id number]  HPI Torey is a pleasant 64 year old white male known to Dr. Carlean Purl. He had undergone colonoscopy in 2005 which was a normal exam. He is referred today by Dr. Maury Dus for follow-up colonoscopy. Patient has no current GI complaints, specifically no complaints of abdominal pain and changes in bowel habits melena or hematochezia. His family history is negative for colon cancer. Patient has history of coronary artery disease and had undergone stent to the RCA in July 2015. He is followed by Dr. Wynonia Lawman and is on aspirin and Plavix.  Also with history of hypertension and hyperlipidemia. Patient says he has done well since coronary stent was placed and has no cardiac symptoms.  Review of Systems Pertinent positive and negative review of systems were noted in the above HPI section.  All other review of systems was otherwise negative.  Outpatient Encounter Prescriptions as of 05/17/2015  Medication Sig  . aspirin EC 81 MG tablet Take 81 mg by mouth every morning.  . Azilsartan Medoxomil (EDARBI) 80 MG TABS Take 80 mg by mouth every morning.  . B Complex-C (SUPER B COMPLEX PO) Take 2 tablets by mouth every morning.  . clopidogrel (PLAVIX) 75 MG tablet Take 1 tablet (75 mg total) by mouth daily with breakfast.  . fluticasone (FLONASE) 50 MCG/ACT nasal spray Place 2 sprays into both nostrils daily as needed (seasonal allergies).   . hydrochlorothiazide (HYDRODIURIL) 25 MG tablet Take 12.5 mg by mouth every morning.  Marland Kitchen LIVALO 2 MG TABS   . metoprolol succinate (TOPROL-XL) 25 MG 24 hr tablet Take 25 mg by mouth every morning.  . Multiple Vitamin (MULTIVITAMIN WITH MINERALS) TABS tablet Take 1 tablet by mouth daily.  . valACYclovir (VALTREX) 1000 MG tablet Take 1,000 mg by mouth every morning.  . [DISCONTINUED] rosuvastatin  (CRESTOR) 20 MG tablet Take 1 tablet (20 mg total) by mouth daily.   No facility-administered encounter medications on file as of 05/17/2015.   Allergies  Allergen Reactions  . Other Hives    Mycins *tolerates zithromax*  . Penicillins Hives   Patient Active Problem List   Diagnosis Date Noted  . Hyperlipidemia 01/22/2014  . Essential hypertension 01/22/2014  . Unstable angina ([place]) 01/22/2014  . CAD (coronary artery disease), native coronary artery 01/22/2014  . Presence of drug coated stent in right coronary artery 01/22/2014  . Obesity (BMI 30-39.9)   . Abnormal stress test    Social History   Social History  . Marital Status: Married    Spouse Name: N/A  . Number of Children: 2  . Years of Education: N/A   Occupational History  . Self Employed    Social History Main Topics  . Smoking status: Never Smoker   . Smokeless tobacco: Never Used  . Alcohol Use: No     Comment: social  . Drug Use: No  . Sexual Activity: Not on file   Other Topics Concern  . Not on file   Social History Narrative    Mr. Hammitt family history includes COPD in his father; Diabetes in his mother; Heart attack in his brother; Hypertension in his father and mother.      Objective:    Filed Vitals:   05/17/15 0853  BP: 110/80  Pulse: 68    Physical Exam  well-developed  white male in no acute distress, pleasant blood pressure 110/80 pulse 68 height 5 foot 10 weight 199. HEENT ;ntraumatic normocephalic EOMI PERRLA sclera anicteric, Cardiovascular ;regular rate and rhythm with S1-S2 no murmur or gallop, Pulmonary ;clear bilaterally, Abdomen ;soft nontender nondistended bowel sounds are active is no palpable mass or hepatosplenomegaly, Rectal ;exam not done, Ext;no clubbing cyanosis or edema skin warm and dry, Neuropsych mood and affect appropriate ;      Assessment & Plan:   #1 64 yo male due for colonoscopy- surveillance- negative colonoscopy 2005 #2 Chronic anti-platelet RX -  on ASA and Plavix #3 CAD s/p stent RCA 7 /2015 #4 HTN #5 hyperlipidemia  Plan; Will schedule for colonoscopy with Dr Carlean Purl . Procedure discussed in detail  with pt and he is agreeable to proceed.  Pt will need to hold Plavix for 5 days prior to colonoscopy- we will communicate with hs cardiologist  Dr Wynonia Lawman to assure this is reasonable for  this pt.  (No appointments available thru end of year- pt will be called , and scheduled when January schedule available)   Alfredia Ferguson PA-C 05/17/2015   Cc: Maury Dus, MD

## 2015-05-17 NOTE — Patient Instructions (Addendum)
We will be calling you with the appointment and instructions.

## 2015-06-02 NOTE — Progress Notes (Signed)
Agree with Ms. Esterwood's assessment and plan. Carl E. Gessner, MD, FACG   

## 2015-06-21 DIAGNOSIS — I2510 Atherosclerotic heart disease of native coronary artery without angina pectoris: Secondary | ICD-10-CM | POA: Diagnosis not present

## 2015-06-21 DIAGNOSIS — Z955 Presence of coronary angioplasty implant and graft: Secondary | ICD-10-CM | POA: Diagnosis not present

## 2015-06-21 DIAGNOSIS — I10 Essential (primary) hypertension: Secondary | ICD-10-CM | POA: Diagnosis not present

## 2015-06-21 DIAGNOSIS — E784 Other hyperlipidemia: Secondary | ICD-10-CM | POA: Diagnosis not present

## 2015-06-21 DIAGNOSIS — E668 Other obesity: Secondary | ICD-10-CM | POA: Diagnosis not present

## 2015-07-07 ENCOUNTER — Encounter: Payer: Self-pay | Admitting: Internal Medicine

## 2015-07-15 ENCOUNTER — Encounter: Payer: Medicare PPO | Admitting: Internal Medicine

## 2015-08-11 ENCOUNTER — Telehealth: Payer: Self-pay | Admitting: *Deleted

## 2015-08-11 ENCOUNTER — Other Ambulatory Visit: Payer: Self-pay | Admitting: *Deleted

## 2015-08-11 DIAGNOSIS — Z7901 Long term (current) use of anticoagulants: Secondary | ICD-10-CM

## 2015-08-11 DIAGNOSIS — Z1211 Encounter for screening for malignant neoplasm of colon: Secondary | ICD-10-CM

## 2015-08-11 NOTE — Telephone Encounter (Signed)
RE: Alex Herrera DOB: 1951-07-04 MRN: [id number]   Dear Dr. Tollie Eth,    We have scheduled the above patient for an endoscopic procedure. Our records show that he is on anticoagulation therapy.   Please advise as to how long the patient may come off his therapy of Plavix prior to the procedure, which is scheduled for 08-23-2015.  Please fax back/ or route the completed form to [facility] at [phone].   Sincerely,    Amy Esterwood PA-C

## 2015-08-12 ENCOUNTER — Other Ambulatory Visit: Payer: Self-pay | Admitting: *Deleted

## 2015-08-12 MED ORDER — NA SULFATE-K SULFATE-MG SULF 17.5-3.13-1.6 GM/177ML PO SOLN: 1.0000 | Freq: Once | ORAL | Status: DC

## 2015-08-17 ENCOUNTER — Telehealth: Payer: Self-pay | Admitting: *Deleted

## 2015-08-17 NOTE — Telephone Encounter (Signed)
08/11/2015   RE: Alex Herrera DOB: 05/21/1951 MRN: [id number]   Dear Dr. Tollie Eth,    We have scheduled the above patient for an endoscopic procedure. Our records show that he is on anticoagulation therapy.   Please advise as to how long the patient may come off his therapy of Plavix prior to the procedure, which is scheduled for 08-23-2015.  Please fax back/ or route the completed form to [place] at [phone].   Sincerely,    Amy Esterwood PA-C

## 2015-08-17 NOTE — Telephone Encounter (Signed)
Advised the patient I have not heard back from Dr. Tollie Eth yet as to him holding the Plavix for his colonoscopy scheduled for Monday 08-23-2015.  I told him he would have to hold it starting tomorrow , Wednesday 08-18-2015 if cleared by Dr. Wynonia Lawman.  I called Mariann Laster, his nurse at [facility] [address] but she was in a room with a patient.  Will call her later today.  I did route Dr. Wynonia Lawman the anticogulation letter again today, 08-17-2015.

## 2015-08-17 NOTE — Telephone Encounter (Signed)
See note from 08-17-2015.

## 2015-08-18 NOTE — Telephone Encounter (Signed)
LM for the patient on his home number to advise Dr. Wynonia Lawman did respond and he stated the patient can hold plavix for 7 days.  Per Nicoletta Ba PA-C , he can hold it for 5 days and start to hold it today Wednesday, 08-18-2015.

## 2015-08-23 ENCOUNTER — Encounter: Payer: Self-pay | Admitting: Internal Medicine

## 2015-08-23 ENCOUNTER — Ambulatory Visit (AMBULATORY_SURGERY_CENTER): Payer: Medicare Other | Admitting: Internal Medicine

## 2015-08-23 VITALS — BP 113/76 | HR 55 | Temp 96.9°F | Resp 24 | Ht 70.0 in | Wt 199.0 lb

## 2015-08-23 DIAGNOSIS — Z1211 Encounter for screening for malignant neoplasm of colon: Secondary | ICD-10-CM | POA: Diagnosis not present

## 2015-08-23 DIAGNOSIS — K5730 Diverticulosis of large intestine without perforation or abscess without bleeding: Secondary | ICD-10-CM

## 2015-08-23 MED ORDER — SODIUM CHLORIDE 0.9 % IV SOLN: 500.0000 mL | INTRAVENOUS | Status: DC

## 2015-08-23 NOTE — Patient Instructions (Addendum)
No polyps or cancer. You do have diverticulosis - thickened muscle rings and pouches in the colon wall. Please read the handout about this condition.  Next routine colonoscopy/screening test in 10 years - 2027  I appreciate the opportunity to care for you. Gatha Mayer, MD, FACG     YOU HAD AN ENDOSCOPIC PROCEDURE TODAY AT [facility]:   Refer to the procedure report that was given to you for any specific questions about what was found during the examination.  If the procedure report does not answer your questions, please call your gastroenterologist to clarify.  If you requested that your care partner not be given the details of your procedure findings, then the procedure report has been included in a sealed envelope for you to review at your convenience later.  YOU SHOULD EXPECT: Some feelings of bloating in the abdomen. Passage of more gas than usual.  Walking can help get rid of the air that was put into your GI tract during the procedure and reduce the bloating. If you had a lower endoscopy (such as a colonoscopy or flexible sigmoidoscopy) you may notice spotting of blood in your stool or on the toilet paper. If you underwent a bowel prep for your procedure, you may not have a normal bowel movement for a few days.  Please Note:  You might notice some irritation and congestion in your nose or some drainage.  This is from the oxygen used during your procedure.  There is no need for concern and it should clear up in a day or so.  SYMPTOMS TO REPORT IMMEDIATELY:   Following lower endoscopy (colonoscopy or flexible sigmoidoscopy):  Excessive amounts of blood in the stool  Significant tenderness or worsening of abdominal pains  Swelling of the abdomen that is new, acute  Fever of 100F or higher   For urgent or emergent issues, a gastroenterologist can be reached at any hour by calling [phone].   DIET: Your first meal following the procedure should be  a small meal and then it is ok to progress to your normal diet. Heavy or fried foods are harder to digest and may make you feel nauseous or bloated.  Likewise, meals heavy in dairy and vegetables can increase bloating.  Drink plenty of fluids but you should avoid alcoholic beverages for 24 hours.  ACTIVITY:  You should plan to take it easy for the rest of today and you should NOT DRIVE or use heavy machinery until tomorrow (because of the sedation medicines used during the test).    FOLLOW UP: Our staff will call the number listed on your records the next business day following your procedure to check on you and address any questions or concerns that you may have regarding the information given to you following your procedure. If we do not reach you, we will leave a message.  However, if you are feeling well and you are not experiencing any problems, there is no need to return our call.  We will assume that you have returned to your regular daily activities without incident.  If any biopsies were taken you will be contacted by phone or by letter within the next 1-3 weeks.  Please call us at [phone] if you have not heard about the biopsies in 3 weeks.    SIGNATURES/CONFIDENTIALITY: You and/or your care partner have signed paperwork which will be entered into your electronic medical record.  These signatures attest to the fact that that the  information above on your After Visit Summary has been reviewed and is understood.  Full responsibility of the confidentiality of this discharge information lies with you and/or your care-partner.    Handout was given to your care partner on diverticulosis. You may resume your current medications today.  Resume your PLAVIX today also. Please call if any questions or concerns.

## 2015-08-23 NOTE — Progress Notes (Signed)
No problems noted in the recovery room. maw 

## 2015-08-23 NOTE — Progress Notes (Signed)
To recovery, report to Willis, RN, VSS 

## 2015-08-23 NOTE — Op Note (Signed)
[place]  [address] [place], [id number]   COLONOSCOPY PROCEDURE REPORT  PATIENT: Alex Herrera, Alex Herrera  MR#: [id number] BIRTHDATE: Dec 20, 1950 , 84  yrs. old GENDER: male ENDOSCOPIST: Gatha Mayer, MD, [facility] PROCEDURE DATE:  08/23/2015 PROCEDURE:   Colonoscopy, screening First Screening Colonoscopy - Avg.  risk and is 50 yrs.  old or older - No.  Prior Negative Screening - Now for repeat screening. 10 or more years since last screening  History of Adenoma - Now for follow-up colonoscopy & has been > or = to 3 yrs.  N/A  Polyps removed today? No Recommend repeat exam, <10 yrs? No ASA CLASS:   Class III INDICATIONS:Screening for colonic neoplasia and Colorectal Neoplasm Risk Assessment for this procedure is average risk. MEDICATIONS: Propofol 200 mg IV and Monitored anesthesia care  DESCRIPTION OF PROCEDURE:   After the risks benefits and alternatives of the procedure were thoroughly explained, informed consent was obtained.  The digital rectal exam revealed no abnormalities of the rectum, revealed no prostatic nodules, and revealed the prostate was not enlarged.   The LB [id number] endoscope was introduced through the anus and advanced to the cecum, which was identified by both the appendix and ileocecal valve. No adverse events experienced.   The quality of the prep was excellent.  (MiraLax was used)  The instrument was then slowly withdrawn as the colon was fully examined. Estimated blood loss is zero unless otherwise noted in this procedure report.      COLON FINDINGS: There was moderate diverticulosis noted in the sigmoid colon.   The examination was otherwise normal.  Retroflexed views revealed no abnormalities. The time to cecum = 2.1 Withdrawal time = 8.4   The scope was withdrawn and the procedure completed. COMPLICATIONS: There were no immediate complications.  ENDOSCOPIC IMPRESSION: 1.   Moderate diverticulosis was noted in the sigmoid  colon 2.   The examination was otherwise normal - excellent prep - second screening  RECOMMENDATIONS: Repeat colonoscopy/screening test 10 years.  2027 resume meds including clopidogrel (Plavix)  eSigned:  Gatha Mayer, MD, [facility] 08/23/2015 10:27 AM   cc: The Patient    and Maury Dus, MD

## 2015-08-24 ENCOUNTER — Telehealth: Payer: Self-pay | Admitting: *Deleted

## 2015-08-24 NOTE — Telephone Encounter (Signed)
  Follow up Call-  Call back number 08/23/2015  Post procedure Call Back phone  # [phone]  Permission to leave phone message Yes     Patient questions:  Message left to call us if necessary.

## 2016-04-03 IMAGING — CR DG CHEST 2V
2 series · 2 of 2 positions shown · non-contrast
Comparison: CT Abdomen and Pelvis 03/04/2004.

CLINICAL DATA: 63-year-old male with chest pain. Pain on exertion.
Hypertension. Initial encounter.

EXAM:
CHEST  2 VIEW

[view not recorded (1 of 2)]
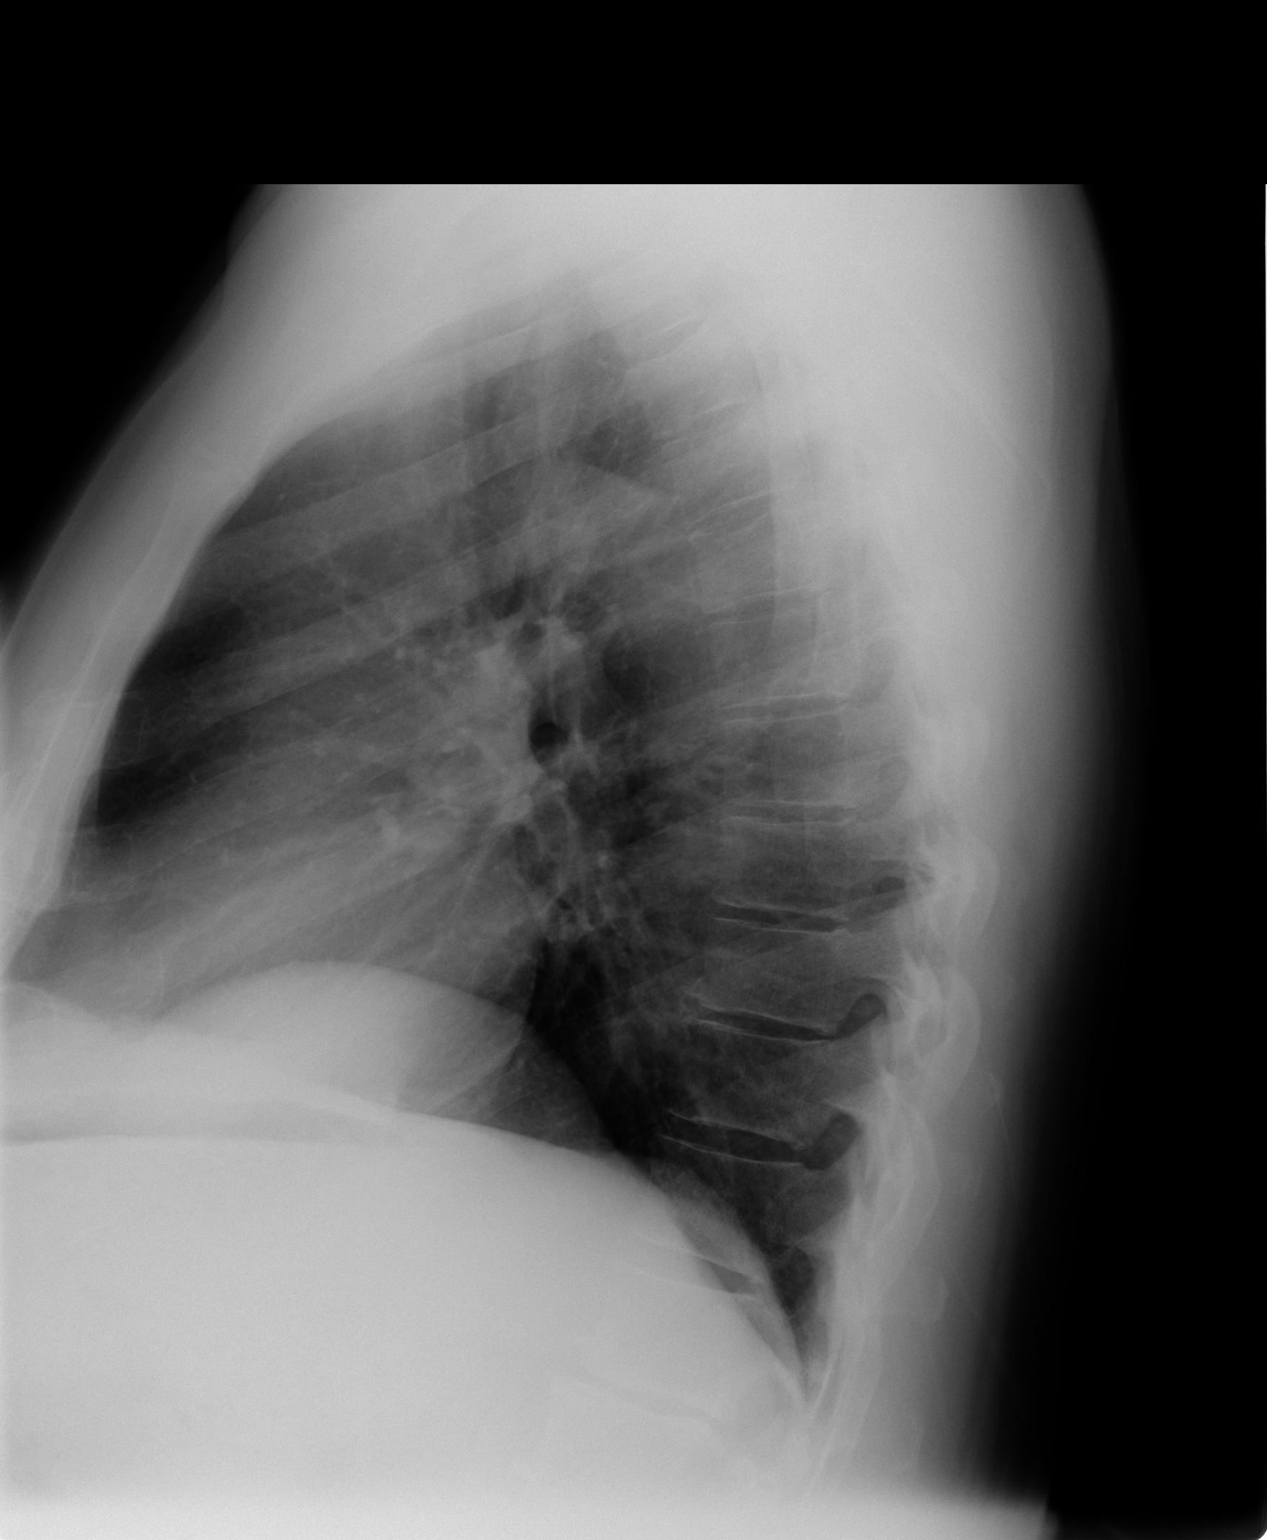

[view not recorded (2 of 2)]
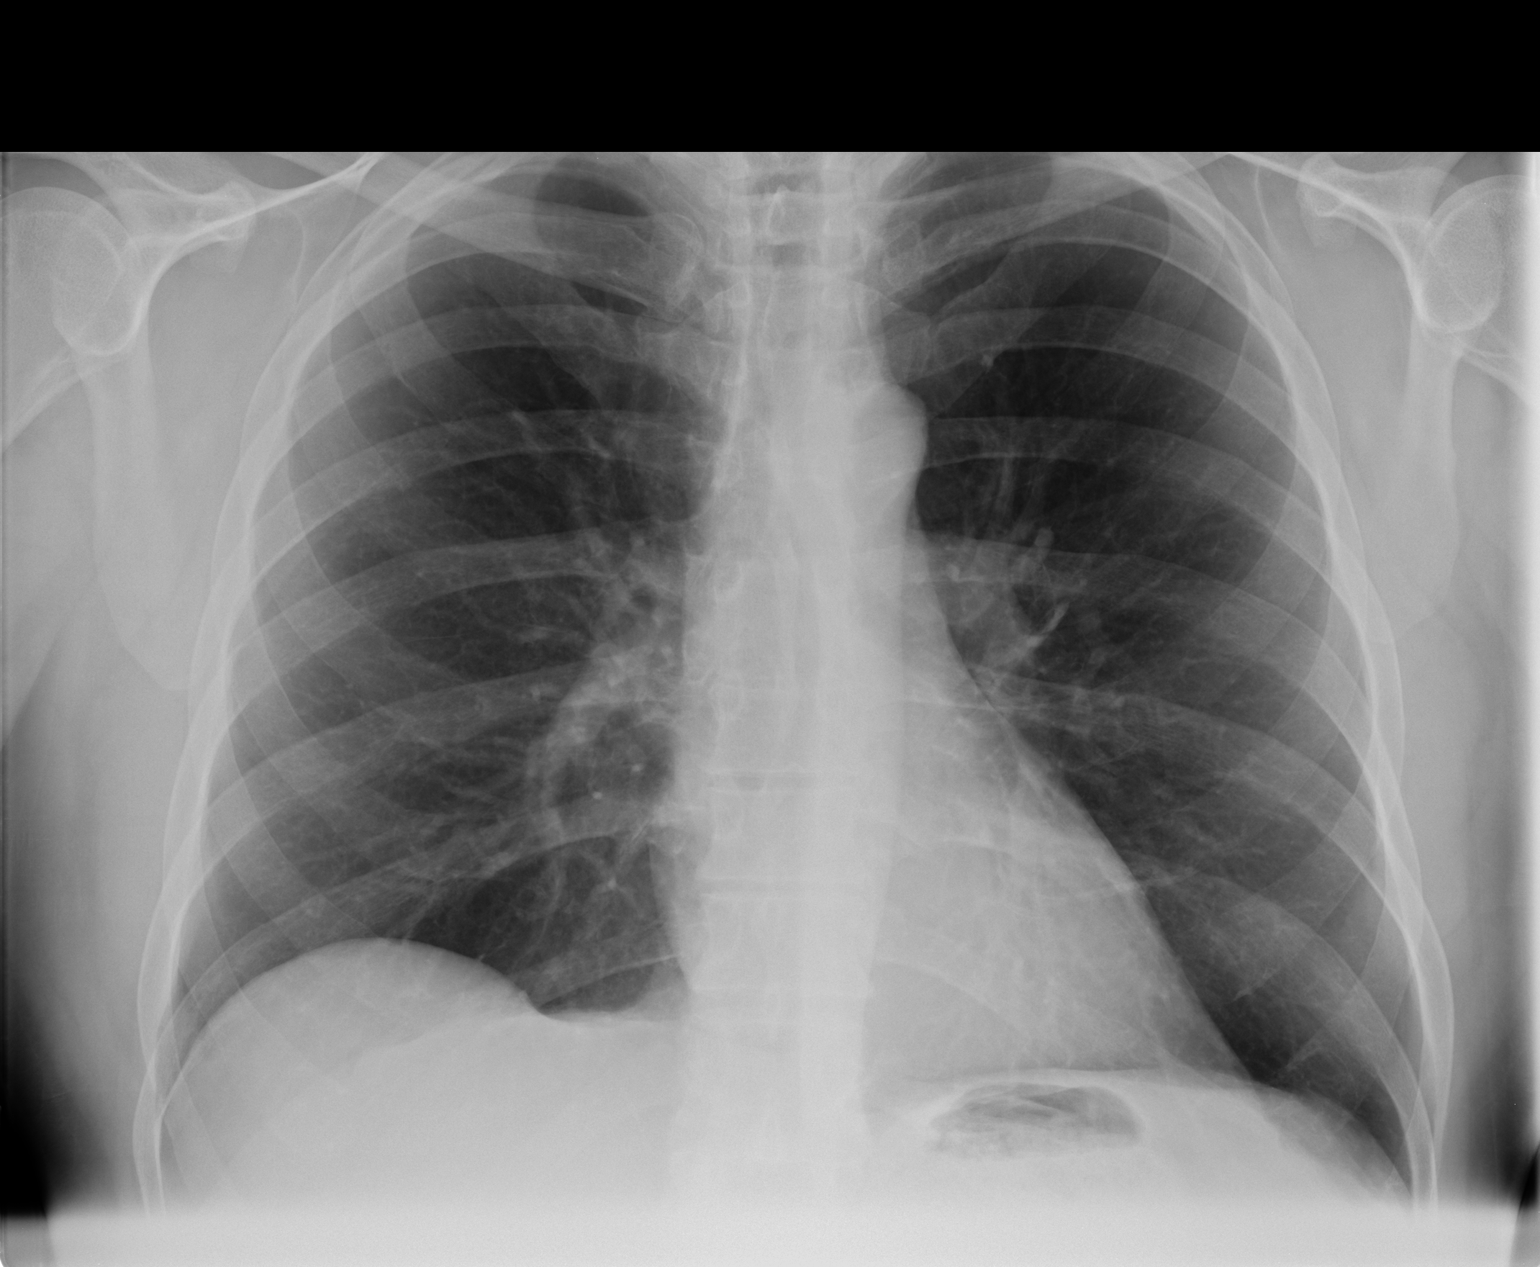

[2 of 2 positions shown; findings below may reference images not displayed]

FINDINGS: Stable eventration of the right hemidiaphragm. Lung volumes are
within normal limits. Normal cardiac size and mediastinal contours.
Visualized tracheal air column is within normal limits. The lungs
are clear. No pneumothorax or effusion. No acute osseous abnormality
identified.
IMPRESSION: No acute cardiopulmonary abnormality.

## 2018-04-26 ENCOUNTER — Inpatient Hospital Stay (HOSPITAL_COMMUNITY)
Admission: EM | Admit: 2018-04-26 | Discharge: 2018-04-28 | DRG: 246 | Disposition: A | Discharge status: Home / Self Care | Payer: Medicare Other | Attending: Interventional Cardiology | Admitting: Interventional Cardiology

## 2018-04-26 ENCOUNTER — Encounter (HOSPITAL_COMMUNITY): Payer: Self-pay | Admitting: Emergency Medicine

## 2018-04-26 ENCOUNTER — Other Ambulatory Visit: Payer: Self-pay

## 2018-04-26 ENCOUNTER — Emergency Department (HOSPITAL_COMMUNITY): Payer: Medicare Other

## 2018-04-26 ENCOUNTER — Encounter (HOSPITAL_COMMUNITY)
Admission: EM | Disposition: A | Discharge status: Home / Self Care | Payer: Self-pay | Source: Home / Self Care | Attending: Interventional Cardiology

## 2018-04-26 ENCOUNTER — Other Ambulatory Visit (HOSPITAL_COMMUNITY): Payer: Self-pay

## 2018-04-26 DIAGNOSIS — Z7902 Long term (current) use of antithrombotics/antiplatelets: Secondary | ICD-10-CM | POA: Diagnosis not present

## 2018-04-26 DIAGNOSIS — I2102 ST elevation (STEMI) myocardial infarction involving left anterior descending coronary artery: Secondary | ICD-10-CM | POA: Diagnosis present

## 2018-04-26 DIAGNOSIS — E782 Mixed hyperlipidemia: Secondary | ICD-10-CM | POA: Diagnosis not present

## 2018-04-26 DIAGNOSIS — Z9861 Coronary angioplasty status: Secondary | ICD-10-CM

## 2018-04-26 DIAGNOSIS — X500XXA Overexertion from strenuous movement or load, initial encounter: Secondary | ICD-10-CM

## 2018-04-26 DIAGNOSIS — I252 Old myocardial infarction: Secondary | ICD-10-CM | POA: Diagnosis present

## 2018-04-26 DIAGNOSIS — Z7982 Long term (current) use of aspirin: Secondary | ICD-10-CM

## 2018-04-26 DIAGNOSIS — E785 Hyperlipidemia, unspecified: Secondary | ICD-10-CM | POA: Diagnosis present

## 2018-04-26 DIAGNOSIS — I10 Essential (primary) hypertension: Secondary | ICD-10-CM | POA: Diagnosis present

## 2018-04-26 DIAGNOSIS — Y9259 Other trade areas as the place of occurrence of the external cause: Secondary | ICD-10-CM

## 2018-04-26 DIAGNOSIS — Z85828 Personal history of other malignant neoplasm of skin: Secondary | ICD-10-CM

## 2018-04-26 DIAGNOSIS — I110 Hypertensive heart disease with heart failure: Secondary | ICD-10-CM | POA: Diagnosis present

## 2018-04-26 DIAGNOSIS — I2510 Atherosclerotic heart disease of native coronary artery without angina pectoris: Secondary | ICD-10-CM | POA: Diagnosis not present

## 2018-04-26 DIAGNOSIS — I213 ST elevation (STEMI) myocardial infarction of unspecified site: Secondary | ICD-10-CM

## 2018-04-26 DIAGNOSIS — H9190 Unspecified hearing loss, unspecified ear: Secondary | ICD-10-CM | POA: Diagnosis present

## 2018-04-26 DIAGNOSIS — I5021 Acute systolic (congestive) heart failure: Secondary | ICD-10-CM

## 2018-04-26 DIAGNOSIS — Z8249 Family history of ischemic heart disease and other diseases of the circulatory system: Secondary | ICD-10-CM

## 2018-04-26 DIAGNOSIS — Z955 Presence of coronary angioplasty implant and graft: Secondary | ICD-10-CM

## 2018-04-26 DIAGNOSIS — I2109 ST elevation (STEMI) myocardial infarction involving other coronary artery of anterior wall: Secondary | ICD-10-CM

## 2018-04-26 DIAGNOSIS — Z880 Allergy status to penicillin: Secondary | ICD-10-CM | POA: Diagnosis not present

## 2018-04-26 DIAGNOSIS — M1990 Unspecified osteoarthritis, unspecified site: Secondary | ICD-10-CM | POA: Diagnosis present

## 2018-04-26 DIAGNOSIS — Z79899 Other long term (current) drug therapy: Secondary | ICD-10-CM

## 2018-04-26 DIAGNOSIS — I25119 Atherosclerotic heart disease of native coronary artery with unspecified angina pectoris: Secondary | ICD-10-CM | POA: Diagnosis present

## 2018-04-26 HISTORY — PX: LEFT HEART CATH AND CORONARY ANGIOGRAPHY: CATH118249

## 2018-04-26 HISTORY — PX: CORONARY STENT INTERVENTION: CATH118234

## 2018-04-26 LAB — POTASSIUM: Potassium: 3.9 mmol/L (ref 3.5–5.1)

## 2018-04-26 LAB — BASIC METABOLIC PANEL
ANION GAP: 14 (ref 5–15)
BUN: 15 mg/dL (ref 8–23)
CO2: 20 mmol/L — AB (ref 22–32)
CREATININE: 1.21 mg/dL (ref 0.61–1.24)
Calcium: 9.9 mg/dL (ref 8.9–10.3)
Chloride: 112 mmol/L — ABNORMAL HIGH (ref 98–111)
GFR, EST NON AFRICAN AMERICAN: 60 mL/min — AB (ref >60)
Glucose, Bld: 170 mg/dL — ABNORMAL HIGH (ref 70–99)
Potassium: 3.6 mmol/L (ref 3.5–5.1)
Sodium: 146 mmol/L — ABNORMAL HIGH (ref 135–145)

## 2018-04-26 LAB — TROPONIN I
TROPONIN I: 0.71 ng/mL — AB (ref <0.03)
TROPONIN I: 17.67 ng/mL — AB (ref <0.03)
Troponin I: <0.03 ng/mL (ref <0.03)

## 2018-04-26 LAB — CBC
HEMATOCRIT: 49.9 % (ref 39.0–52.0)
Hemoglobin: 16.8 g/dL (ref 13.0–17.0)
MCH: 31.1 pg (ref 26.0–34.0)
MCHC: 33.7 g/dL (ref 30.0–36.0)
MCV: 92.4 fL (ref 78.0–100.0)
Platelets: 277 10*3/uL (ref 150–400)
RBC: 5.4 MIL/uL (ref 4.22–5.81)
RDW: 13 % (ref 11.5–15.5)
WBC: 9.6 10*3/uL (ref 4.0–10.5)

## 2018-04-26 LAB — LIPID PANEL
CHOLESTEROL: 146 mg/dL (ref 0–200)
HDL: 57 mg/dL (ref >40)
LDL CALC: 71 mg/dL (ref 0–99)
TRIGLYCERIDES: 92 mg/dL (ref <150)
Total CHOL/HDL Ratio: 2.6 RATIO
VLDL: 18 mg/dL (ref 0–40)

## 2018-04-26 LAB — MRSA PCR SCREENING: MRSA by PCR: NEGATIVE

## 2018-04-26 LAB — APTT: APTT: 26 s (ref 24–36)

## 2018-04-26 LAB — POCT I-STAT TROPONIN I: Troponin i, poc: 0.02 ng/mL (ref 0.00–0.08)

## 2018-04-26 LAB — PROTIME-INR
INR: 0.91
Prothrombin Time: 12.2 seconds (ref 11.4–15.2)

## 2018-04-26 LAB — POCT ACTIVATED CLOTTING TIME: Activated Clotting Time: 417 seconds

## 2018-04-26 SURGERY — LEFT HEART CATH AND CORONARY ANGIOGRAPHY
Anesthesia: LOCAL

## 2018-04-26 MED ORDER — METOPROLOL TARTRATE 12.5 MG HALF TABLET
12.5000 mg | ORAL_TABLET | Freq: Two times a day (BID) | ORAL | Status: DC
  Administered 2018-04-27: 12.5 mg via ORAL
  Filled 2018-04-26: qty 1

## 2018-04-26 MED ORDER — VALACYCLOVIR HCL 500 MG PO TABS
1000.0000 mg | ORAL_TABLET | Freq: Every morning | ORAL | Status: DC
  Administered 2018-04-27 – 2018-04-28 (×2): 1000 mg via ORAL
  Filled 2018-04-26 (×2): qty 2

## 2018-04-26 MED ORDER — ACETAMINOPHEN 325 MG PO TABS
650.0000 mg | ORAL_TABLET | ORAL | Status: DC | PRN
  Administered 2018-04-27: 650 mg via ORAL
  Filled 2018-04-26: qty 2

## 2018-04-26 MED ORDER — TIROFIBAN HCL IN NACL 5-0.9 MG/100ML-% IV SOLN
INTRAVENOUS | Status: AC | PRN
  Administered 2018-04-26: 0.15 ug/kg/min via INTRAVENOUS

## 2018-04-26 MED ORDER — ASPIRIN 81 MG PO CHEW
324.0000 mg | CHEWABLE_TABLET | Freq: Once | ORAL | Status: AC
  Administered 2018-04-26: 324 mg via ORAL
  Filled 2018-04-26: qty 4

## 2018-04-26 MED ORDER — TICAGRELOR 90 MG PO TABS
ORAL_TABLET | ORAL | Status: DC | PRN
  Administered 2018-04-26: 180 mg via ORAL

## 2018-04-26 MED ORDER — TRAMADOL HCL 50 MG PO TABS
50.0000 mg | ORAL_TABLET | Freq: Four times a day (QID) | ORAL | Status: DC | PRN
  Administered 2018-04-26: 50 mg via ORAL
  Filled 2018-04-26: qty 1

## 2018-04-26 MED ORDER — LABETALOL HCL 5 MG/ML IV SOLN: 10.0000 mg | INTRAVENOUS | Status: AC | PRN

## 2018-04-26 MED ORDER — NITROGLYCERIN 1 MG/10 ML FOR IR/CATH LAB
INTRA_ARTERIAL | Status: AC
  Filled 2018-04-26: qty 10

## 2018-04-26 MED ORDER — TIROFIBAN (AGGRASTAT) BOLUS VIA INFUSION
INTRAVENOUS | Status: DC | PRN
  Administered 2018-04-26: 2250 ug via INTRAVENOUS

## 2018-04-26 MED ORDER — TICAGRELOR 90 MG PO TABS
ORAL_TABLET | ORAL | Status: AC
  Filled 2018-04-26: qty 2

## 2018-04-26 MED ORDER — HYDRALAZINE HCL 20 MG/ML IJ SOLN: 5.0000 mg | INTRAMUSCULAR | Status: AC | PRN

## 2018-04-26 MED ORDER — NITROGLYCERIN 1 MG/10 ML FOR IR/CATH LAB
INTRA_ARTERIAL | Status: DC | PRN
  Administered 2018-04-26: 200 ug via INTRACORONARY

## 2018-04-26 MED ORDER — ATORVASTATIN CALCIUM 80 MG PO TABS
80.0000 mg | ORAL_TABLET | Freq: Every day | ORAL | Status: DC
  Administered 2018-04-26 – 2018-04-27 (×2): 80 mg via ORAL
  Filled 2018-04-26: qty 1
  Filled 2018-04-26: qty 2

## 2018-04-26 MED ORDER — SODIUM CHLORIDE 0.9 % IV SOLN: 250.0000 mL | INTRAVENOUS | Status: DC | PRN

## 2018-04-26 MED ORDER — ONDANSETRON HCL 4 MG/2ML IJ SOLN: 4.0000 mg | Freq: Four times a day (QID) | INTRAMUSCULAR | Status: DC | PRN

## 2018-04-26 MED ORDER — METOPROLOL SUCCINATE ER 25 MG PO TB24: 25.0000 mg | ORAL_TABLET | Freq: Every morning | ORAL | Status: DC

## 2018-04-26 MED ORDER — TIROFIBAN HCL IN NACL 5-0.9 MG/100ML-% IV SOLN
INTRAVENOUS | Status: AC
  Filled 2018-04-26: qty 100

## 2018-04-26 MED ORDER — ONDANSETRON 4 MG PO TBDP
4.0000 mg | ORAL_TABLET | Freq: Once | ORAL | Status: AC | PRN
  Administered 2018-04-26: 4 mg via ORAL
  Filled 2018-04-26: qty 1

## 2018-04-26 MED ORDER — TIROFIBAN HCL IN NACL 5-0.9 MG/100ML-% IV SOLN
0.1500 ug/kg/min | INTRAVENOUS | Status: AC
  Filled 2018-04-26: qty 100

## 2018-04-26 MED ORDER — HEPARIN SODIUM (PORCINE) 1000 UNIT/ML IJ SOLN
INTRAMUSCULAR | Status: DC | PRN
  Administered 2018-04-26: 10000 [IU] via INTRAVENOUS

## 2018-04-26 MED ORDER — SODIUM CHLORIDE 0.9 % IV SOLN
INTRAVENOUS | Status: AC | PRN
  Administered 2018-04-26: 50 mL/h via INTRAVENOUS

## 2018-04-26 MED ORDER — ASPIRIN EC 81 MG PO TBEC: 81.0000 mg | DELAYED_RELEASE_TABLET | Freq: Every morning | ORAL | Status: DC

## 2018-04-26 MED ORDER — HEPARIN SODIUM (PORCINE) 5000 UNIT/ML IJ SOLN
60.0000 [IU]/kg | Freq: Once | INTRAMUSCULAR | Status: AC
  Administered 2018-04-26: 5000 [IU] via INTRAVENOUS
  Filled 2018-04-26: qty 1

## 2018-04-26 MED ORDER — SODIUM CHLORIDE 0.9 % IV SOLN
INTRAVENOUS | Status: DC
  Administered 2018-04-26: 20 mL/h via INTRAVENOUS

## 2018-04-26 MED ORDER — HEPARIN (PORCINE) IN NACL 1000-0.9 UT/500ML-% IV SOLN
INTRAVENOUS | Status: DC | PRN
  Administered 2018-04-26 (×2): 500 mL

## 2018-04-26 MED ORDER — FENTANYL CITRATE (PF) 100 MCG/2ML IJ SOLN
100.0000 ug | Freq: Once | INTRAMUSCULAR | Status: AC
  Administered 2018-04-26: 100 ug via INTRAVENOUS

## 2018-04-26 MED ORDER — VERAPAMIL HCL 2.5 MG/ML IV SOLN
INTRAVENOUS | Status: AC
  Filled 2018-04-26: qty 2

## 2018-04-26 MED ORDER — TICAGRELOR 90 MG PO TABS
90.0000 mg | ORAL_TABLET | Freq: Two times a day (BID) | ORAL | Status: DC
  Administered 2018-04-26 – 2018-04-28 (×4): 90 mg via ORAL
  Filled 2018-04-26 (×4): qty 1

## 2018-04-26 MED ORDER — VERAPAMIL HCL 2.5 MG/ML IV SOLN
INTRAVENOUS | Status: DC | PRN
  Administered 2018-04-26: 10 mL via INTRA_ARTERIAL

## 2018-04-26 MED ORDER — IRBESARTAN 300 MG PO TABS
300.0000 mg | ORAL_TABLET | Freq: Every day | ORAL | Status: DC
  Administered 2018-04-26 – 2018-04-28 (×3): 300 mg via ORAL
  Filled 2018-04-26 (×3): qty 1

## 2018-04-26 MED ORDER — FLUTICASONE PROPIONATE 50 MCG/ACT NA SUSP
2.0000 | Freq: Every day | NASAL | Status: DC | PRN
  Filled 2018-04-26: qty 16

## 2018-04-26 MED ORDER — ASPIRIN 81 MG PO CHEW
81.0000 mg | CHEWABLE_TABLET | Freq: Every day | ORAL | Status: DC
  Administered 2018-04-27 – 2018-04-28 (×2): 81 mg via ORAL
  Filled 2018-04-26 (×2): qty 1

## 2018-04-26 MED ORDER — MIDAZOLAM HCL 2 MG/2ML IJ SOLN
INTRAMUSCULAR | Status: AC
  Filled 2018-04-26: qty 2

## 2018-04-26 MED ORDER — FENTANYL CITRATE (PF) 100 MCG/2ML IJ SOLN
INTRAMUSCULAR | Status: DC | PRN
  Administered 2018-04-26: 50 ug via INTRAVENOUS
  Administered 2018-04-26: 25 ug via INTRAVENOUS

## 2018-04-26 MED ORDER — SODIUM CHLORIDE 0.9% FLUSH
3.0000 mL | Freq: Two times a day (BID) | INTRAVENOUS | Status: DC
  Administered 2018-04-26 – 2018-04-28 (×5): 3 mL via INTRAVENOUS

## 2018-04-26 MED ORDER — FENTANYL CITRATE (PF) 100 MCG/2ML IJ SOLN
INTRAMUSCULAR | Status: AC
  Filled 2018-04-26: qty 2

## 2018-04-26 MED ORDER — IOHEXOL 350 MG/ML SOLN
INTRAVENOUS | Status: DC | PRN
  Administered 2018-04-26: 105 mL via INTRAVENOUS

## 2018-04-26 MED ORDER — MIDAZOLAM HCL 2 MG/2ML IJ SOLN
INTRAMUSCULAR | Status: DC | PRN
  Administered 2018-04-26 (×2): 1 mg via INTRAVENOUS

## 2018-04-26 MED ORDER — SODIUM CHLORIDE 0.9% FLUSH: 3.0000 mL | INTRAVENOUS | Status: DC | PRN

## 2018-04-26 MED ORDER — FUROSEMIDE 10 MG/ML IJ SOLN
40.0000 mg | Freq: Once | INTRAMUSCULAR | Status: AC
  Administered 2018-04-26: 40 mg via INTRAVENOUS
  Filled 2018-04-26: qty 4

## 2018-04-26 SURGICAL SUPPLY — 22 items
BALLN SAPPHIRE 2.5X15 (BALLOONS) ×2
BALLN ~~LOC~~ EMERGE MR 3.5X12 (BALLOONS) ×2
BALLOON SAPPHIRE 2.5X15 (BALLOONS) ×1 IMPLANT
BALLOON ~~LOC~~ EMERGE MR 3.5X12 (BALLOONS) ×1 IMPLANT
CATH 5FR JL3.5 JR4 ANG PIG MP (CATHETERS) ×2 IMPLANT
CATH EXTRAC PRONTO 5.5F 138CM (CATHETERS) ×2 IMPLANT
CATH LAUNCHER 6FR EBU 3 (CATHETERS) ×2 IMPLANT
DEVICE RAD COMP TR BAND LRG (VASCULAR PRODUCTS) ×2 IMPLANT
ELECT DEFIB PAD ADLT CADENCE (PAD) ×2 IMPLANT
GLIDESHEATH SLEND SS 6F .021 (SHEATH) ×2 IMPLANT
GUIDEWIRE INQWIRE 1.5J.035X260 (WIRE) ×1 IMPLANT
INQWIRE 1.5J .035X260CM (WIRE) ×2
KIT ENCORE 26 ADVANTAGE (KITS) ×2 IMPLANT
KIT HEART LEFT (KITS) ×2 IMPLANT
KIT HEMO VALVE WATCHDOG (MISCELLANEOUS) ×2 IMPLANT
PACK CARDIAC CATHETERIZATION (CUSTOM PROCEDURE TRAY) ×2 IMPLANT
STENT SYNERGY DES 3.5X8 (Permanent Stent) ×2 IMPLANT
STENT SYNERGY DES 3X38 (Permanent Stent) ×2 IMPLANT
TRANSDUCER W/STOPCOCK (MISCELLANEOUS) ×2 IMPLANT
TUBING CIL FLEX 10 FLL-RA (TUBING) ×2 IMPLANT
WIRE ASAHI PROWATER 180CM (WIRE) ×2 IMPLANT
WIRE HI TORQ BMW 190CM (WIRE) ×2 IMPLANT

## 2018-04-26 NOTE — H&P (Addendum)
History & Physical    Patient ID: Alex Herrera MRN: [id number], DOB/AGE: 68-18-1952   Admit date: 04/26/2018  Primary Physician: Maury Dus, MD Primary Cardiologist: Dr. Wynonia Lawman   Patient Profile    67 yo male with PMH of CAD s/p PCI to the RCA ('15), HTN, HL who presented with chest pain to [facility]. Initial EKG nonacute, but repeat with recurrent chest pain noted acute STEMI and CODE STEMI called.   Past Medical History   Past Medical History:  Diagnosis Date  . Allergic rhinitis   . Anginal pain ([place])   . Arthritis   . Coronary artery disease   . Headache(784.0)   . Hearing loss   . History of kidney stones   . Hyperlipidemia   . Hypertension   . Skin cancer, basal cell 2000    Past Surgical History:  Procedure Laterality Date  . CARDIAC CATHETERIZATION    . COLONOSCOPY    . LEFT HEART CATHETERIZATION WITH CORONARY ANGIOGRAM N/A 01/22/2014   Procedure: LEFT HEART CATHETERIZATION WITH CORONARY ANGIOGRAM;  Surgeon: Jacolyn Reedy, MD;  Location: [facility] CATH LAB;  Service: Cardiovascular;  Laterality: N/A;  . NASAL SEPTUM SURGERY     1970's  . PERCUTANEOUS CORONARY STENT INTERVENTION (PCI-S)  01/22/2014   Procedure: PERCUTANEOUS CORONARY STENT INTERVENTION (PCI-S);  Surgeon: Jacolyn Reedy, MD;  Location: [facility] CATH LAB;  Service: Cardiovascular;;  . PTCA  01/22/2014   PTCA/DES 1 PROXIMAL RCA           DR TILLEY  . VASECTOMY      Allergies  Allergies  Allergen Reactions  . Other Hives    Mycins *tolerates zithromax*  . Penicillins Hives    History of Present Illness    Alex Herrera is a 67 yo male with PMH of CAD s/p PCI to the RCA ('15), HTN, HL. He is followed by Dr. Wynonia Lawman and had a PCI/DES to the RCA back in 2015. 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 Also developed nausea and diaphoresis. Called family with his symptoms who brought him to [facility]. In triage his initial EKG  showed SR with no acute ST changes. Developed recurrent chest pain while in triage and repeat EKG showed ST elevation in anterolateral leads with depression in inferior leads. CODE STEMI was called. He was given 324mg  ASA and 5000 units of heparin, along with fentanyl with improvement in pain and EKG. Transferred to [facility] for emergent cardiac cath.   Home Medications    Prior to Admission medications   Medication Sig Start Date End Date Taking? Authorizing Provider  aspirin EC 81 MG tablet Take 81 mg by mouth every morning.    [provider]  Azilsartan Medoxomil (EDARBI) 80 MG TABS Take 80 mg by mouth every morning.    [provider]  B Complex-C (SUPER B COMPLEX PO) Take 2 tablets by mouth every morning.    [provider]  clopidogrel (PLAVIX) 75 MG tablet Take 1 tablet (75 mg total) by mouth daily with breakfast. 01/23/14   Jacolyn Reedy, MD  fluticasone [facility]) 50 MCG/ACT nasal spray Place 2 sprays into both nostrils daily as needed (seasonal allergies).     [provider]  hydrochlorothiazide (HYDRODIURIL) 25 MG tablet Take 12.5 mg by mouth every morning.    [provider]  LIVALO 2 MG TABS  04/27/15   [provider]  metoprolol succinate (TOPROL-XL)  25 MG 24 hr tablet Take 25 mg by mouth every morning.    [provider]  Multiple Vitamin (MULTIVITAMIN WITH MINERALS) TABS tablet Take 1 tablet by mouth daily.    [provider]  valACYclovir (VALTREX) 1000 MG tablet Take 1,000 mg by mouth every morning.    [provider]    Family History    Family History  Problem Relation Age of Onset  . Hypertension Father        Deceased  . COPD Father   . Hypertension Mother   . Diabetes Mother   . Heart attack Brother     Social History    Social History   Socioeconomic History  . Marital status: Married    Spouse name: Not on file  . Number of children: 2  . Years of education: Not on file  .  Highest education level: Not on file  Occupational History  . Occupation: Self Employed  Social Needs  . Financial resource strain: Not on file  . Food insecurity:    Worry: Not on file    Inability: Not on file  . Transportation needs:    Medical: Not on file    Non-medical: Not on file  Tobacco Use  . Smoking status: Never Smoker  . Smokeless tobacco: Never Used  Substance and Sexual Activity  . Alcohol use: No    Alcohol/week: 0.0 standard drinks    Comment: social  . Drug use: No  . Sexual activity: Not on file  Lifestyle  . Physical activity:    Days per week: Not on file    Minutes per session: Not on file  . Stress: Not on file  Relationships  . Social connections:    Talks on phone: Not on file    Gets together: Not on file    Attends religious service: Not on file    Active member of club or organization: Not on file    Attends meetings of clubs or organizations: Not on file    Relationship status: Not on file  . Intimate partner violence:    Fear of current or ex partner: Not on file    Emotionally abused: Not on file    Physically abused: Not on file    Forced sexual activity: Not on file  Other Topics Concern  . Not on file  Social History Narrative  . Not on file     Review of Systems    See HPI  All other systems reviewed and are otherwise negative except as noted above.  Physical Exam    Blood pressure (!) 166/97, pulse 60, temperature 98.2 F (36.8 C), temperature source Oral, resp. rate (!) 22, SpO2 100 %.  General: WM, pale and diaphoretic, NAD Psych: Normal affect. Neuro: Alert and oriented X 3. Moves all extremities spontaneously. HEENT: Normal  Neck: Supple without bruits or JVD. Lungs:  Resp regular and unlabored, CTA. Heart: RRR no murmurs. Abdomen: Soft, non-tender, non-distended, BS + x 4.  Extremities: No clubbing, cyanosis or edema. DP/PT/Radials 2+ and equal bilaterally.  Labs    Troponin [facility] of Care Test) Recent Labs      04/26/18 1248  TROPIPOC 0.02   No results for input(s): CKTOTAL, CKMB, TROPONINI in the last 72 hours. Lab Results  Component Value Date   WBC 6.9 01/23/2014   HGB 14.8 01/23/2014   HCT 44.5 01/23/2014   MCV 92.9 01/23/2014   PLT 222 01/23/2014   No results for input(s): NA,  K, CL, CO2, BUN, CREATININE, CALCIUM, PROT, BILITOT, ALKPHOS, ALT, AST, GLUCOSE in the last 168 hours.  Invalid input(s): LABALBU No results found for: CHOL, HDL, LDLCALC, TRIG No results found for: [facility]   Radiology Studies    Dg Chest 2 View  Result Date: 04/26/2018 CLINICAL DATA:  Chest pain. EXAM: CHEST - 2 VIEW COMPARISON:  Radiographs of January 14, 2014. FINDINGS: The heart size and mediastinal contours are within normal limits. Both lungs are clear. No pneumothorax or pleural effusion is noted. The visualized skeletal structures are unremarkable. IMPRESSION: No active cardiopulmonary disease. Electronically Signed   By: Marijo Conception, M.D.   On: 04/26/2018 12:21    ECG & Cardiac Imaging    EKG: SR with ST elevation in anterolateral leads, and depression in inferior leads.   Assessment & Plan    67 yo male with PMH of CAD s/p PCI to the RCA ('15), HTN, HL who presented with chest pain to [facility]. Initial EKG nonacute, but repeat with recurrent chest pain noted acute STEMI and CODE STEMI called.   1. 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 Initial EKG was non acute but repeat while in triage at [facility] showed acute ST elevation in anterolateral leads. Given 324mg  ASA and 5000 units of heparin. Transferred to [facility] for emergent cardiac cath. Further recommendations post cath.   2. CAD s/p PCI to RCA: back in 2015. Appears he has been on plavix as an outpatient since that time.   3. HTN: continue home BB  Severity of Illness: The appropriate patient status for this patient is INPATIENT. Inpatient status is judged to be reasonable and necessary in order to  provide the required intensity of service to ensure the patient's safety. The patient's presenting symptoms, physical exam findings, and initial radiographic and laboratory data in the context of their chronic comorbidities is felt to place them at high risk for further clinical deterioration. Furthermore, it is not anticipated that the patient will be medically stable for discharge from the hospital within 2 midnights of admission. The following factors support the patient status of inpatient.   " The patient's presenting symptoms include chest pain, shortness of breath, and  diaphoresis. " The worrisome physical exam findings include pale, and diaphoretic. " The initial radiographic and laboratory data are worrisome because of EKG concerning for STEMI. " The chronic co-morbidities include HTN, HL and CAD.  * I certify that at the point of admission it is my clinical judgment that the patient will require inpatient hospital care spanning beyond 2 midnights from the point of admission due to high intensity of service, high risk for further deterioration and high frequency of surveillance required.*   Signed, Reino Bellis, NP-C Pager [phone] 04/26/2018, 1:24 PM   I have examined the patient and reviewed assessment and plan and discussed with patient.  Agree with above as stated.  I personally reviewed the ECG which showed dynamic anterior ST changes.  Cath showed subtotal occlusion of the proximal LAD.  Overlapping DES to the proximal to mid LAD placed.  CP imoroved.  Continue DAPT.  IV Lasix given for increased LVEDP, showing acute systolic heart failure in the setting of acute LAD occlusion.  Herrera echo to eval EF.  Increase to high potency statin.  Was on Livalo in the past. He was intolerant of Crestor in the past.   May not tolerate beta blocker due to bradycardia.  Watch in ICU tonight.   His  daughter's wedding is in 35 day, in [place] , [place].  He may be able to go to the wedding but  was planning on driving by himself.  He cannot drive by himself to [place]. Wife is aware.  Larae Grooms

## 2018-04-26 NOTE — Progress Notes (Signed)
CRITICAL VALUE ALERT  Critical Value: Troponin 0.71  Date & Time Notied:  04/26/2018  1722  Provider Notified: Reino Bellis, NP Orders Received/Actions taken: expected value, no orders received

## 2018-04-26 NOTE — ED Triage Notes (Signed)
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 Nausea, no vomiting.

## 2018-04-26 NOTE — Progress Notes (Signed)
CRITICAL VALUE ALERT  Critical Value:  Troponin 17.67  Date & Time Notied:  04/26/18 2343  Provider Notified: Dr. Doylene Canning  Orders Received/Actions taken: Expected value, no additional orders at this time.

## 2018-04-26 NOTE — ED Notes (Signed)
Pt complaint of sudden onset of nausea; ODT zofran administered; while administering nausea medication pt grabs chest verbalizing severe chest pain. Pt diaphoretic and leads falling off from previous EKG obtained. New stickers/leads placed for 2nd EKG to be obtained related to change in pt status. Attempted to obtain VS pt fidgeting and restless unable to obtain BP.

## 2018-04-26 NOTE — ED Provider Notes (Signed)
[place] DEPT Provider Note   CSN: [id number] Arrival date & time: 04/26/18  1149     History   Chief Complaint Chief Complaint  Patient presents with  . Chest Pain  . Nausea    HPI Alex Herrera is a 67 y.o. male.  HPI  67 year old male with history of coronary artery disease comes in with chief complaint of chest pain.  Patient reports that his chest pain started about 45 minutes ago, while he was lifting heavy boxes.  Pain is described as dull, generalized but severe.  Patient has associated shortness of breath, nausea and diaphoresis.  He has history of CAD with PCI placed in 2015, however he never had a STEMI.  According to the nurse, patient's chest pain acutely worsened since she arrived to the triage, therefore she ordered a repeat EKG.  Past Medical History:  Diagnosis Date  . Allergic rhinitis   . Anginal pain ([place])   . Arthritis   . Coronary artery disease   . Headache(784.0)   . Hearing loss   . History of kidney stones   . Hyperlipidemia   . Hypertension   . Skin cancer, basal cell 2000    Patient Active Problem List   Diagnosis Date Noted  . Hyperlipidemia 01/22/2014  . Essential hypertension 01/22/2014  . Unstable angina ([place]) 01/22/2014  . CAD (coronary artery disease), native coronary artery 01/22/2014  . Presence of drug coated stent in right coronary artery 01/22/2014  . Obesity (BMI 30-39.9)   . Abnormal stress test     Past Surgical History:  Procedure Laterality Date  . CARDIAC CATHETERIZATION    . COLONOSCOPY    . LEFT HEART CATHETERIZATION WITH CORONARY ANGIOGRAM N/A 01/22/2014   Procedure: LEFT HEART CATHETERIZATION WITH CORONARY ANGIOGRAM;  Surgeon: Jacolyn Reedy, MD;  Location: [facility] CATH LAB;  Service: Cardiovascular;  Laterality: N/A;  . NASAL SEPTUM SURGERY     1970's  . PERCUTANEOUS CORONARY STENT INTERVENTION (PCI-S)  01/22/2014   Procedure: PERCUTANEOUS CORONARY STENT INTERVENTION (PCI-S);   Surgeon: Jacolyn Reedy, MD;  Location: [facility] CATH LAB;  Service: Cardiovascular;;  . PTCA  01/22/2014   PTCA/DES 1 PROXIMAL RCA           DR TILLEY  . VASECTOMY          Home Medications    Prior to Admission medications   Medication Sig Start Date End Date Taking? Authorizing Provider  aspirin EC 81 MG tablet Take 81 mg by mouth every morning.    [provider]  Azilsartan Medoxomil (EDARBI) 80 MG TABS Take 80 mg by mouth every morning.    [provider]  B Complex-C (SUPER B COMPLEX PO) Take 2 tablets by mouth every morning.    [provider]  clopidogrel (PLAVIX) 75 MG tablet Take 1 tablet (75 mg total) by mouth daily with breakfast. 01/23/14   Jacolyn Reedy, MD  fluticasone [facility]) 50 MCG/ACT nasal spray Place 2 sprays into both nostrils daily as needed (seasonal allergies).     [provider]  hydrochlorothiazide (HYDRODIURIL) 25 MG tablet Take 12.5 mg by mouth every morning.    [provider]  LIVALO 2 MG TABS  04/27/15   [provider]  metoprolol succinate (TOPROL-XL) 25 MG 24 hr tablet Take 25 mg by mouth every morning.    [provider]  Multiple Vitamin (MULTIVITAMIN WITH MINERALS) TABS tablet Take 1 tablet by mouth daily.    [provider]  valACYclovir (VALTREX) 1000 MG tablet Take 1,000 mg by mouth every morning.    [provider]    Family History Family History  Problem Relation Age of Onset  . Hypertension Father        Deceased  . COPD Father   . Hypertension Mother   . Diabetes Mother   . Heart attack Brother     Social History Social History   Tobacco Use  . Smoking status: Never Smoker  . Smokeless tobacco: Never Used  Substance Use Topics  . Alcohol use: No    Alcohol/week: 0.0 standard drinks    Comment: social  . Drug use: No     Allergies   Other and Penicillins   Review of Systems Review of Systems  Unable to perform ROS: Acuity of condition    Constitutional: Positive for activity change.  Respiratory: Positive for shortness of breath.   Cardiovascular: Positive for chest pain.  Gastrointestinal: Positive for nausea.  Hematological: Does not bruise/bleed easily.     Physical Exam Updated Vital Signs BP (!) 166/97 (BP Location: Right Arm)   Pulse (!) 59   Temp 98.2 F (36.8 C) (Oral)   Resp 16   SpO2 99%   Physical Exam  Constitutional: He is oriented to person, place, and time. He appears well-developed. He appears distressed.  HENT:  Head: Atraumatic.  Eyes: Conjunctivae and EOM are normal.  Neck: Neck supple.  Cardiovascular: Intact distal pulses and normal pulses. Bradycardia present.  Pulmonary/Chest: Effort normal and breath sounds normal.  Abdominal: Soft. Bowel sounds are normal. He exhibits no distension. There is no tenderness.  Neurological: He is alert and oriented to person, place, and time.  Skin: Skin is warm. He is diaphoretic.  Nursing note and vitals reviewed.    ED Treatments / Results  Labs (all labs ordered are listed, but only abnormal results are displayed) Labs Reviewed  BASIC METABOLIC PANEL  CBC  PROTIME-INR  APTT  TROPONIN I  LIPID PANEL  I-STAT TROPONIN, ED  POCT I-STAT TROPONIN I    EKG EKG Interpretation  Date/Time:  Friday April 26 2018 11:55:25 EDT Ventricular Rate:  55 PR Interval:    QRS Duration: 91 QT Interval:  413 QTC Calculation: 395 R Axis:   39 Text Interpretation:  Sinus rhythm Abnormal R-wave progression, early transition No acute changes Confirmed by Varney Biles [phone]) on 04/26/2018 12:39:48 PM  ED ECG REPORT   Date: 04/26/2018  Rate: 61  Rhythm: normal sinus rhythm  QRS Axis: normal  Intervals: normal  ST/T Wave abnormalities: nonspecific ST/T changesl, ST elevation in the anterior leads  Conduction Disutrbances:nonspecific intraventricular conduction delay  Narrative Interpretation:   Old EKG Reviewed: Changes noted  I have personally  reviewed the EKG tracing and agree with the computerized printout as noted.    ED ECG REPORT   Date: 04/26/2018  Rate: 50  Rhythm: normal sinus rhythm  QRS Axis: normal  Intervals: normal  ST/T Wave abnormalities: ST depressions anteriorly and ST depressions laterally  Conduction Disutrbances:nonspecific intraventricular conduction delay  Narrative Interpretation:   Old EKG Reviewed: changes noted  I have personally reviewed the EKG tracing and agree with the computerized printout as noted.         Radiology Dg Chest 2 View  Result Date: 04/26/2018 CLINICAL DATA:  Chest pain. EXAM: CHEST - 2 VIEW COMPARISON:  Radiographs of January 14, 2014. FINDINGS: The heart size and mediastinal contours are within normal limits. Both lungs are clear.  No pneumothorax or pleural effusion is noted. The visualized skeletal structures are unremarkable. IMPRESSION: No active cardiopulmonary disease. Electronically Signed   By: Marijo Conception, M.D.   On: 04/26/2018 12:21    Procedures Procedures (including critical care time)  CRITICAL CARE Performed by: Tishina Lown   Total critical care time: 36 minutes  Critical care time was exclusive of separately billable procedures and treating other patients.  Critical care was necessary to treat or prevent imminent or life-threatening deterioration.  Critical care was time spent personally by me on the following activities: development of treatment plan with patient and/or surrogate as well as nursing, discussions with consultants, evaluation of patient's response to treatment, examination of patient, obtaining history from patient or surrogate, ordering and performing treatments and interventions, ordering and review of laboratory studies, ordering and review of radiographic studies, pulse oximetry and re-evaluation of patient's condition.   Medications Ordered in ED Medications  0.9 %  sodium chloride infusion (20 mL/hr Intravenous New  Bag/Given 04/26/18 1253)  heparin injection 60 Units/kg (has no administration in time range)  fentaNYL (SUBLIMAZE) 100 MCG/2ML injection (has no administration in time range)  ondansetron (ZOFRAN-ODT) disintegrating tablet 4 mg (4 mg Oral Given 04/26/18 1231)  fentaNYL (SUBLIMAZE) injection 100 mcg (100 mcg Intravenous Given 04/26/18 1248)  aspirin chewable tablet 324 mg (324 mg Oral Given 04/26/18 1251)     Initial Impression / Assessment and Plan / ED Course  I have reviewed the triage vital signs and the nursing notes.  Pertinent labs & imaging results that were available during my care of the patient were reviewed by me and considered in my medical decision making (see chart for details).  Clinical Course as of Apr 26 1308  Fri Apr 26, 2018  1307 Patient's chest pain resolved minutes after he received fentanyl.  His EKG also improved, and went from wide-complex or narrow complex.  Repeat EKG does not show clear evidence of STEMI.  Questioning if patient had significant coronary vasospasms.  I spoke with Dr. Angelena Form, the interventional cardiologist about the change.  They will still see the patient at the Cath Lab and reassess.   [AN]    Clinical Course User Index [AN] Varney Biles, MD    67 year old male comes in with chief complaint of sudden onset chest pain.  Chest pain started while patient was moving heavy boxes.  Initially his pain was tolerable, however worse since arrival to the triage his pain acutely got worse.  Repeat EKG done shows signs that are concerning for STEMI.  Code STEMI activated as soon as EKG was interpreted. EMS to be called for transport. Patient will receive heparin and aspirin in the ED.  Patient is not a smoker, the symptoms are acute and there is no clinical concerns for PE or dissection at this time.  Final Clinical Impressions(s) / ED Diagnoses   Final diagnoses:  ST elevation myocardial infarction (STEMI), unspecified artery [facility])    ED  Discharge Orders    None       Varney Biles, MD 04/26/18 1312

## 2018-04-26 NOTE — ED Notes (Addendum)
Patient in triage room complaining of sudden onset of severe chest pain, nausea, and sweating again. Reports episode come and go. Second EKG to be completed.

## 2018-04-27 ENCOUNTER — Inpatient Hospital Stay (HOSPITAL_COMMUNITY): Payer: Medicare Other

## 2018-04-27 ENCOUNTER — Other Ambulatory Visit: Payer: Self-pay

## 2018-04-27 DIAGNOSIS — I2109 ST elevation (STEMI) myocardial infarction involving other coronary artery of anterior wall: Secondary | ICD-10-CM

## 2018-04-27 LAB — CBC
HEMATOCRIT: 45.4 % (ref 39.0–52.0)
HEMOGLOBIN: 15.1 g/dL (ref 13.0–17.0)
MCH: 31.1 pg (ref 26.0–34.0)
MCHC: 33.3 g/dL (ref 30.0–36.0)
MCV: 93.6 fL (ref 78.0–100.0)
Platelets: 224 10*3/uL (ref 150–400)
RBC: 4.85 MIL/uL (ref 4.22–5.81)
RDW: 12.9 % (ref 11.5–15.5)
WBC: 12.8 10*3/uL — ABNORMAL HIGH (ref 4.0–10.5)

## 2018-04-27 LAB — ECHOCARDIOGRAM COMPLETE
Height: 70 in
Weight: 3262.81 oz

## 2018-04-27 LAB — BASIC METABOLIC PANEL
ANION GAP: 12 (ref 5–15)
BUN: 15 mg/dL (ref 8–23)
CO2: 23 mmol/L (ref 22–32)
Calcium: 9 mg/dL (ref 8.9–10.3)
Chloride: 104 mmol/L (ref 98–111)
Creatinine, Ser: 1.13 mg/dL (ref 0.61–1.24)
GFR calc Af Amer: >60 mL/min (ref >60)
GLUCOSE: 114 mg/dL — AB (ref 70–99)
POTASSIUM: 3.7 mmol/L (ref 3.5–5.1)
Sodium: 139 mmol/L (ref 135–145)

## 2018-04-27 LAB — TROPONIN I
TROPONIN I: 20.4 ng/mL — AB (ref <0.03)
Troponin I: 15.54 ng/mL (ref <0.03)

## 2018-04-27 MED ORDER — SPIRONOLACTONE 12.5 MG HALF TABLET
12.5000 mg | ORAL_TABLET | Freq: Every day | ORAL | Status: DC
  Administered 2018-04-27: 12.5 mg via ORAL
  Filled 2018-04-27: qty 1

## 2018-04-27 MED ORDER — POTASSIUM CHLORIDE CRYS ER 20 MEQ PO TBCR
40.0000 meq | EXTENDED_RELEASE_TABLET | Freq: Once | ORAL | Status: AC
  Administered 2018-04-27: 40 meq via ORAL
  Filled 2018-04-27: qty 2

## 2018-04-27 MED ORDER — CARVEDILOL 6.25 MG PO TABS
6.2500 mg | ORAL_TABLET | Freq: Two times a day (BID) | ORAL | Status: DC
  Administered 2018-04-27 – 2018-04-28 (×2): 6.25 mg via ORAL
  Filled 2018-04-27 (×2): qty 1

## 2018-04-27 NOTE — Progress Notes (Addendum)
Patient ID: Dareon Nunziato, male   DOB: March 29, 1951, 67 y.o.   MRN: [id number]     PCP-Cardiologist: No primary care provider on file.   Subjective:    No complaints this morning.  No further chest pain, no dyspnea.  Diuresed well overnight.    Objective:   Weight Range: 92.5 kg Body mass index is 29.26 kg/m.   Vital Signs:   Temp:  [97.4 F (36.3 C)-98.2 F (36.8 C)] 97.4 F (36.3 C) (10/05 0733) Pulse Rate:  [40-70] 62 (10/05 0000) Resp:  [10-25] 12 (10/05 0800) BP: (112-177)/(60-97) 129/73 (10/05 0800) SpO2:  [92 %-100 %] 95 % (10/05 0800) Weight:  [90.3 kg-92.5 kg] 92.5 kg (10/04 1451)    Weight change: Filed Weights   04/26/18 1342 04/26/18 1451  Weight: 90.3 kg 92.5 kg    Intake/Output:   Intake/Output Summary (Last 24 hours) at 04/27/2018 0946 Last data filed at 04/27/2018 0800 Gross per 24 hour  Intake 1204.71 ml  Output 2275 ml  Net -1070.29 ml      Physical Exam    General:  Well appearing. No resp difficulty HEENT: Normal Neck: Supple. JVP not elevated. Carotids 2+ bilat; no bruits. No lymphadenopathy or thyromegaly appreciated. Cor: PMI nondisplaced. Regular rate & rhythm. No rubs, gallops or murmurs. Lungs: Clear Abdomen: Soft, nontender, nondistended. No hepatosplenomegaly. No bruits or masses. Good bowel sounds. Extremities: No cyanosis, clubbing, rash, edema Neuro: Alert & orientedx3, cranial nerves grossly intact. moves all 4 extremities w/o difficulty. Affect pleasant   Telemetry   NSR in 60s.    Labs    CBC Recent Labs    04/26/18 1312 04/27/18 0456  WBC 9.6 12.8*  HGB 16.8 15.1  HCT 49.9 45.4  MCV 92.4 93.6  PLT 277 097   Basic Metabolic Panel Recent Labs    04/26/18 1312 04/26/18 2228 04/27/18 0456  NA 146*  --  139  K 3.6 3.9 3.7  CL 112*  --  104  CO2 20*  --  23  GLUCOSE 170*  --  114*  BUN 15  --  15  CREATININE 1.21  --  1.13  CALCIUM 9.9  --  9.0   Liver Function Tests No results for input(s): AST,  ALT, ALKPHOS, BILITOT, PROT, ALBUMIN in the last 72 hours. No results for input(s): LIPASE, AMYLASE in the last 72 hours. Cardiac Enzymes Recent Labs    04/26/18 1537 04/26/18 2228 04/27/18 0456  TROPONINI 0.71* 17.67* 20.40*    BNP: BNP (last 3 results) No results for input(s): BNP in the last 8760 hours.  ProBNP (last 3 results) No results for input(s): PROBNP in the last 8760 hours.   D-Dimer No results for input(s): DDIMER in the last 72 hours. Hemoglobin A1C No results for input(s): HGBA1C in the last 72 hours. Fasting Lipid Panel Recent Labs    04/26/18 1313  CHOL 146  HDL 57  LDLCALC 71  TRIG 92  CHOLHDL 2.6   Thyroid Function Tests No results for input(s): TSH, T4TOTAL, T3FREE, THYROIDAB in the last 72 hours.  Invalid input(s): FREET3  Other results:   Imaging    Dg Chest 2 View  Result Date: 04/26/2018 CLINICAL DATA:  Chest pain. EXAM: CHEST - 2 VIEW COMPARISON:  Radiographs of January 14, 2014. FINDINGS: The heart size and mediastinal contours are within normal limits. Both lungs are clear. No pneumothorax or pleural effusion is noted. The visualized skeletal structures are unremarkable. IMPRESSION: No active cardiopulmonary disease. Electronically Signed   By:  Marijo Conception, M.D.   On: 04/26/2018 12:21      Medications:     Scheduled Medications: . aspirin  81 mg Oral Daily  . atorvastatin  80 mg Oral q1800  . carvedilol  6.25 mg Oral BID WC  . irbesartan  300 mg Oral Daily  . sodium chloride flush  3 mL Intravenous Q12H  . spironolactone  12.5 mg Oral Daily  . ticagrelor  90 mg Oral BID  . valACYclovir  1,000 mg Oral q morning - 10a     Infusions: . sodium chloride 10 mL/hr at 04/27/18 0800  . sodium chloride       PRN Medications:  sodium chloride, acetaminophen, fluticasone, ondansetron (ZOFRAN) IV, sodium chloride flush, traMADol    Assessment/Plan   1. CAD: Patient presented with anterior STEMI, had prior history of RCA  PCI.  LHC 10/4 showed subtotal occlusion proximal LAD with patent old RCA stent.  Patient had overlapping Synergy DES x 2 to proximal and mid LAD.  No further chest pain today.  - Continue ASA 81, ticagrelor, atorvastatin 80 mg daily.  2. CHF: LVEDP 39 at time of cath, got Lasix 40 mg IV x 1.  He diuresed well.  No LV-gram was done and awaiting echo. On exam today, he is not volume overloaded.  - Echo today.  - I do not think that he needs further Lasix.  - Would transition off metoprolol and onto Coreg 6.25 mg bid.  - Can give spironolactone 12.5 mg daily for now pending EF evaluation.  - He is on irbesartan.  If EF is decreased on echo, would transition to [place].  - If EF < 35% on echo, would plan Lifevest.   He can go to telemetry today.  Home tomorrow if EF is preserved, if significantly decreased may want to hold off until Monday to adjust meds and arrange for Lifevest if needed.   Length of Stay: 1  Loralie Champagne, MD  04/27/2018, 9:46 AM  Advanced Heart Failure Team Pager [phone] (M-F; 7a - 4p)  Please contact [facility] for night-coverage after hours (4p -7a ) and weekends on [email or web address]

## 2018-04-27 NOTE — Progress Notes (Signed)
CARDIAC REHAB PHASE I   PRE:  Rate/Rhythm: 63  BP:  Sitting: 135/78     SaO2: 94 ra  MODE:  Ambulation: 350 ft   POST:  Rate/Rhythm: 70  BP:  Sitting: 137/79     SaO2: 96ra  12:35p-1:15p Patient ambulated with no complaints. Education completed. Agreed to Cardiac Rehab at [facility]. In chair eating lunch with call bell in reach.  [place], MS 04/27/2018 1:11 PM

## 2018-04-27 NOTE — Progress Notes (Signed)
CRITICAL VALUE ALERT  Critical Value:  Troponin 20.40, Potassium 3.7  Date & Time Notied:  04/27/18 0700  Provider Notified: Dr. Gwenlyn Found  Orders Received/Actions taken: 40 meq PO potassium once. Recheck potassium level tomorrow AM.

## 2018-04-27 NOTE — Progress Notes (Signed)
  Echocardiogram 2D Echocardiogram has been performed.  Merrie Roof F 04/27/2018, 12:26 PM

## 2018-04-28 DIAGNOSIS — I10 Essential (primary) hypertension: Secondary | ICD-10-CM | POA: Diagnosis present

## 2018-04-28 LAB — CBC WITH DIFFERENTIAL/PLATELET
ABS IMMATURE GRANULOCYTES: 0 10*3/uL (ref 0.0–0.1)
BASOS PCT: 0 %
Basophils Absolute: 0 10*3/uL (ref 0.0–0.1)
EOS ABS: 0.1 10*3/uL (ref 0.0–0.7)
Eosinophils Relative: 2 %
HEMATOCRIT: 45.5 % (ref 39.0–52.0)
Hemoglobin: 15.1 g/dL (ref 13.0–17.0)
IMMATURE GRANULOCYTES: 0 %
LYMPHS ABS: 2.4 10*3/uL (ref 0.7–4.0)
Lymphocytes Relative: 27 %
MCH: 31 pg (ref 26.0–34.0)
MCHC: 33.2 g/dL (ref 30.0–36.0)
MCV: 93.4 fL (ref 78.0–100.0)
MONO ABS: 1 10*3/uL (ref 0.1–1.0)
Monocytes Relative: 11 %
Neutro Abs: 5.5 10*3/uL (ref 1.7–7.7)
Neutrophils Relative %: 60 %
PLATELETS: 204 10*3/uL (ref 150–400)
RBC: 4.87 MIL/uL (ref 4.22–5.81)
RDW: 12.7 % (ref 11.5–15.5)
WBC: 9 10*3/uL (ref 4.0–10.5)

## 2018-04-28 LAB — BASIC METABOLIC PANEL
ANION GAP: 8 (ref 5–15)
BUN: 16 mg/dL (ref 8–23)
CO2: 26 mmol/L (ref 22–32)
Calcium: 8.9 mg/dL (ref 8.9–10.3)
Chloride: 104 mmol/L (ref 98–111)
Creatinine, Ser: 1.12 mg/dL (ref 0.61–1.24)
GFR calc Af Amer: >60 mL/min (ref >60)
GFR calc non Af Amer: >60 mL/min (ref >60)
Glucose, Bld: 98 mg/dL (ref 70–99)
POTASSIUM: 3.9 mmol/L (ref 3.5–5.1)
SODIUM: 138 mmol/L (ref 135–145)

## 2018-04-28 MED ORDER — TICAGRELOR 90 MG PO TABS: 90.0000 mg | ORAL_TABLET | Freq: Two times a day (BID) | ORAL | 3 refills | Status: DC

## 2018-04-28 MED ORDER — ASPIRIN 81 MG PO CHEW: 81.0000 mg | CHEWABLE_TABLET | Freq: Every day | ORAL | Status: DC

## 2018-04-28 MED ORDER — ACETAMINOPHEN 325 MG PO TABS: 650.0000 mg | ORAL_TABLET | ORAL | Status: DC | PRN

## 2018-04-28 MED ORDER — ATORVASTATIN CALCIUM 80 MG PO TABS: 80.0000 mg | ORAL_TABLET | Freq: Every day | ORAL | 3 refills | Status: DC

## 2018-04-28 NOTE — Discharge Summary (Addendum)
Patient ID: Alex Herrera,  MRN: [id number], DOB/AGE: Oct 08, 1950 67 y.o.  Admit date: 04/26/2018 Discharge date: 04/28/2018  Primary Care Provider: Maury Dus, MD Primary Cardiologist: Dr Wynonia Lawman (Dr Irish Lack to see)  Discharge Diagnoses Principal Problem:   STEMI involving left anterior descending coronary artery ([place])   LAD STEMI 104/19- Synergy DES x 2. EF 55-60%  Active Problems:   Acute systolic CHF (congestive heart failure) (HCC)   LVEDP 39 at cath- treated with IV Lasix x 1    CAD S/P PCI    01/22/14 Cath  99%RCA, treated with Xience stent.    04/26/18- Anterior STEMI- treated with overlapping Synergy DES-EF           55-60%, prior RCA stent patent.    Dyslipidemia, goal LDL below 70     LDL 71 on Livalo     HTN    On ARB  Procedures: Urgent cath/PCI 04/26/18                        Echo 04/27/18   Hospital Course: 67 yo male with PMH of CAD followed by Dr Wynonia Lawman. The patient had a PCI to the RCA ('15). Other medical problems include HL. The patient presented to [facility] 04/26/18 with chest pain. Initial EKG nonacute, but repeat EKG with recurrent chest pain noted acute STEMI and CODE STEMI called. The patient was transferred to [facility] for urgent cath. This revealed a 99% pLAD and an 80% mLAD. The previously placed RCA was patent and there was no signifcant CFX or LM disease noted. The patient underwent PCI with DES x 2. His LVEDP was elevated and he was treated with IV Lasix x 1. Troponin peaked at 20. Echo done 04/27/18 showed preserved LVF with an EF of 55-60%. The pt was ambulated and Dr Haroldine Laws saw him on rounds 04/28/18 and feels he can be discharged. He'll need an TOC office visit in 7-14 days.   Discharge Vitals:  Blood pressure 124/86, pulse 62, temperature 98 F (36.7 C), temperature source Oral, resp. rate 20, height 5\' 10"  (1.778 m), weight 90.5 kg, SpO2 92 %.    Labs: Results for orders placed or performed during the hospital encounter of 04/26/18 (from  the past 24 hour(s))  Basic metabolic panel     Status: None   Collection Time: 04/28/18  2:23 AM  Result Value Ref Range   Sodium 138 135 - 145 mmol/L   Potassium 3.9 3.5 - 5.1 mmol/L   Chloride 104 98 - 111 mmol/L   CO2 26 22 - 32 mmol/L   Glucose, Bld 98 70 - 99 mg/dL   BUN 16 8 - 23 mg/dL   Creatinine, Ser 1.12 0.61 - 1.24 mg/dL   Calcium 8.9 8.9 - 10.3 mg/dL   GFR calc non Af Amer >60 >60 mL/min   GFR calc Af Amer >60 >60 mL/min   Anion gap 8 5 - 15  CBC with Differential/Platelet     Status: None   Collection Time: 04/28/18  2:23 AM  Result Value Ref Range   WBC 9.0 4.0 - 10.5 K/uL   RBC 4.87 4.22 - 5.81 MIL/uL   Hemoglobin 15.1 13.0 - 17.0 g/dL   HCT 45.5 39.0 - 52.0 %   MCV 93.4 78.0 - 100.0 fL   MCH 31.0 26.0 - 34.0 pg   MCHC 33.2 30.0 - 36.0 g/dL   RDW 12.7 11.5 - 15.5 %   Platelets 204 150 -  400 K/uL   Neutrophils Relative % 60 %   Neutro Abs 5.5 1.7 - 7.7 K/uL   Lymphocytes Relative 27 %   Lymphs Abs 2.4 0.7 - 4.0 K/uL   Monocytes Relative 11 %   Monocytes Absolute 1.0 0.1 - 1.0 K/uL   Eosinophils Relative 2 %   Eosinophils Absolute 0.1 0.0 - 0.7 K/uL   Basophils Relative 0 %   Basophils Absolute 0.0 0.0 - 0.1 K/uL   Immature Granulocytes 0 %   Abs Immature Granulocytes 0.0 0.0 - 0.1 K/uL    Disposition:  Follow-up Information    Jettie Booze, MD Follow up.   Specialties:  Cardiology, Radiology, Interventional Cardiology Why:  office will contact you to see Dr Hassell Done APP in 7-14 days Contact information: [address] [address] [phone]           Discharge Medications:  Allergies as of 04/28/2018      Reactions   Other Hives   Mycins *tolerates zithromax* (unsure which medication caused the reaction as a teenager)   Penicillins Hives   Has patient had a PCN reaction causing immediate rash, facial/tongue/throat swelling, SOB or lightheadedness with hypotension: Yes Has patient had a PCN reaction  causing severe rash involving mucus membranes or skin necrosis: No Has patient had a PCN reaction that required hospitalization: No Has patient had a PCN reaction occurring within the last 10 years: No If all of the above answers are "NO", then may proceed with Cephalosporin use.      Medication List    STOP taking these medications   ALEVE PM 220-25 MG Tabs Generic drug:  Naproxen Sod-diphenhydrAMINE   aspirin EC 81 MG tablet Replaced by:  aspirin 81 MG chewable tablet   clopidogrel 75 MG tablet Commonly known as:  PLAVIX   ibuprofen 200 MG tablet Commonly known as:  ADVIL,MOTRIN   LIVALO 2 MG Tabs Generic drug:  Pitavastatin Calcium     TAKE these medications   acetaminophen 325 MG tablet Commonly known as:  TYLENOL Take 2 tablets (650 mg total) by mouth every 4 (four) hours as needed for headache or mild pain.   aspirin 81 MG chewable tablet Chew 1 tablet (81 mg total) by mouth daily. Start taking on:  04/29/2018 Replaces:  aspirin EC 81 MG tablet   atorvastatin 80 MG tablet Commonly known as:  LIPITOR Take 1 tablet (80 mg total) by mouth daily at 6 PM.   COQ10 PO Take 1 capsule by mouth daily.   EDARBI 80 MG Tabs Generic drug:  Azilsartan Medoxomil Take 80 mg by mouth daily.   fluticasone 50 MCG/ACT nasal spray Commonly known as:  FLONASE Place 2 sprays into both nostrils daily as needed (seasonal allergies).   gabapentin 300 MG capsule Commonly known as:  NEURONTIN Take 300 mg by mouth at bedtime.   GLUCOSAMINE-CHONDROITIN PO Take 2 tablets by mouth daily.   metoprolol succinate 25 MG 24 hr tablet Commonly known as:  TOPROL-XL Take 25 mg by mouth at bedtime.   mometasone 0.1 % cream Commonly known as:  ELOCON Apply 1 application topically daily as needed (wound care).   SAW PALMETTO PO Take 2 capsules by mouth daily.   ticagrelor 90 MG Tabs tablet Commonly known as:  BRILINTA Take 1 tablet (90 mg total) by mouth 2 (two) times daily.     valACYclovir 1000 MG tablet Commonly known as:  VALTREX Take 1,000 mg by mouth daily.  Duration of Discharge Encounter: Greater than 30 minutes including physician time.  Angelena Form PA-C 04/28/2018 11:28 AM   Patient seen and examined with the above-signed Advanced Practice Provider and/or Housestaff. I personally reviewed laboratory data, imaging studies and relevant notes. I independently examined the patient and formulated the important aspects of the plan. I have edited the note to reflect any of my changes or salient points. I have personally discussed the plan with the patient and/or family.  He is stable for d/c. See my rounding note for further details.   Glori Bickers, MD  3:40 PM

## 2018-04-28 NOTE — Progress Notes (Signed)
Patient was discharged without Brilinta card, and without notification to Case Management.  CM called [name] and provided BIN # etc to run coupon, and updated patient that he will have the first 30 days free when he picks up his Rx.

## 2018-04-28 NOTE — Progress Notes (Signed)
Discharge instructions given to patient. All questions answered. All belongings sent with patient. Escorted to vehicle by Psychologist, counselling.  Shella Spearing, RN

## 2018-04-28 NOTE — Progress Notes (Addendum)
Patient ID: Alex Herrera, male   DOB: [birth year], 67 y.o.   MRN: [id number]     PCP-Cardiologist: No primary care provider on file.   Subjective:    Ambulated 350 feet with CR. No CP or dyspnea. Peak trop 20.4  Echo with EF 55-60%. Hypokinesis of the midanteroseptal myocardium.    Objective:   Weight Range: 90.5 kg Body mass index is 28.63 kg/m.   Vital Signs:   Temp:  [97.8 F (36.6 C)-98.1 F (36.7 C)] 98 F (36.7 C) (10/06 0734) Resp:  [11-22] 20 (10/06 1000) BP: (96-142)/(62-95) 124/86 (10/06 1000) SpO2:  [92 %-97 %] 92 % (10/06 1000) Weight:  [90.5 kg] 90.5 kg (10/06 0500) Last BM Date: 04/25/18  Weight change: Filed Weights   04/26/18 1342 04/26/18 1451 04/28/18 0500  Weight: 90.3 kg 92.5 kg 90.5 kg    Intake/Output:   Intake/Output Summary (Last 24 hours) at 04/28/2018 1030 Last data filed at 04/28/2018 0900 Gross per 24 hour  Intake 960 ml  Output 1225 ml  Net -265 ml      Physical Exam    General:  Well appearing. No resp difficulty HEENT: normal Neck: supple. no JVD. Carotids 2+ bilat; no bruits. No lymphadenopathy or thryomegaly appreciated. Cor: PMI nondisplaced. Regular rate & rhythm. No rubs, gallops or murmurs. Lungs: clear Abdomen: soft, nontender, nondistended. No hepatosplenomegaly. No bruits or masses. Good bowel sounds. Extremities: no cyanosis, clubbing, rash, edema Neuro: alert & orientedx3, cranial nerves grossly intact. moves all 4 extremities w/o difficulty. Affect pleasant   Telemetry   NSR in 60s.    Labs    CBC Recent Labs    04/27/18 0456 04/28/18 0223  WBC 12.8* 9.0  NEUTROABS  --  5.5  HGB 15.1 15.1  HCT 45.4 45.5  MCV 93.6 93.4  PLT 224 409   Basic Metabolic Panel Recent Labs    04/27/18 0456 04/28/18 0223  NA 139 138  K 3.7 3.9  CL 104 104  CO2 23 26  GLUCOSE 114* 98  BUN 15 16  CREATININE 1.13 1.12  CALCIUM 9.0 8.9   Liver Function Tests No results for input(s): AST, ALT, ALKPHOS, BILITOT,  PROT, ALBUMIN in the last 72 hours. No results for input(s): LIPASE, AMYLASE in the last 72 hours. Cardiac Enzymes Recent Labs    04/26/18 2228 04/27/18 0456 04/27/18 1112  TROPONINI 17.67* 20.40* 15.54*    BNP: BNP (last 3 results) No results for input(s): BNP in the last 8760 hours.  ProBNP (last 3 results) No results for input(s): PROBNP in the last 8760 hours.   D-Dimer No results for input(s): DDIMER in the last 72 hours. Hemoglobin A1C No results for input(s): HGBA1C in the last 72 hours. Fasting Lipid Panel Recent Labs    04/26/18 1313  CHOL 146  HDL 57  LDLCALC 71  TRIG 92  CHOLHDL 2.6   Thyroid Function Tests No results for input(s): TSH, T4TOTAL, T3FREE, THYROIDAB in the last 72 hours.  Invalid input(s): FREET3  Other results:   Imaging    No results found.   Medications:     Scheduled Medications: . aspirin  81 mg Oral Daily  . atorvastatin  80 mg Oral q1800  . carvedilol  6.25 mg Oral BID WC  . irbesartan  300 mg Oral Daily  . sodium chloride flush  3 mL Intravenous Q12H  . spironolactone  12.5 mg Oral Daily  . ticagrelor  90 mg Oral BID  . valACYclovir  1,000 mg Oral  q morning - 10a    Infusions: . sodium chloride Stopped (04/27/18 0810)  . sodium chloride      PRN Medications: sodium chloride, acetaminophen, fluticasone, ondansetron (ZOFRAN) IV, sodium chloride flush, traMADol    Assessment/Plan   1. CAD: Patient presented with anterior STEMI, had prior history of RCA PCI.  LHC 10/4 showed subtotal occlusion proximal LAD with patent old RCA stent.  Patient had overlapping Synergy DES x 2 to proximal and mid LAD.  No further chest pain today. Ambulated well with CR - Continue ASA 81, ticagrelor, atorvastatin 80 mg daily.  2. CHF: LVEDP 39 at time of cath, got Lasix 40 mg IV x 1.  He diuresed well.  No LV-gram was done - Echo EF 55-60% (persoanlly reviewed) - Volume status looks good.  - Can go home today  Toprol XL 25  qhs ECASA 81 daily Brilinta 90 bid Atorva 80 (previously could not tolerate Crestor due to myalgias - may need PCSK-9) Losartan 50 daily  Refer outpatient CR.  F/u Dr. Irish Lack     Length of Stay: 2  Glori Bickers, MD  04/28/2018, 10:30 AM  Advanced Heart Failure Team Pager [phone] (M-F; 7a - 4p)  Please contact [facility] for night-coverage after hours (4p -7a ) and weekends on [email or web address]

## 2018-04-28 NOTE — Discharge Instructions (Signed)
Coronary Angiogram With Stent, Care After °This sheet gives you information about how to care for yourself after your procedure. Your health care provider may also give you more specific instructions. If you have problems or questions, contact your health care provider. °What can I expect after the procedure? °After your procedure, it is common to have: °· Bruising in the area where a small, thin tube (catheter) was inserted. This usually fades within 1-2 weeks. °· Blood collecting in the tissue (hematoma) that may be painful to the touch. It should usually decrease in size and tenderness within 1-2 weeks. ° °Follow these instructions at home: °Insertion area care °· Do not take baths, swim, or use a hot tub until your health care provider approves. °· You may shower 24-48 hours after the procedure or as directed by your health care provider. °· Follow instructions from your health care provider about how to take care of your incision. Make sure you: °? Wash your hands with soap and water before you change your bandage (dressing). If soap and water are not available, use hand sanitizer. °? Change your dressing as told by your health care provider. °? Leave stitches (sutures), skin glue, or adhesive strips in place. These skin closures may need to stay in place for 2 weeks or longer. If adhesive strip edges start to loosen and curl up, you may trim the loose edges. Do not remove adhesive strips completely unless your health care provider tells you to do that. °· Remove the bandage (dressing) and gently wash the catheter insertion site with plain soap and water. °· Pat the area dry with a clean towel. Do not rub the area, because that may cause bleeding. °· Do not apply powder or lotion to the incision area. °· Check your incision area every day for signs of infection. Check for: °? More redness, swelling, or pain. °? More fluid or blood. °? Warmth. °? Pus or a bad smell. °Activity °· Do not drive for 24 hours if you  were given a medicine to help you relax (sedative). °· Do not lift anything that is heavier than 10 lb (4.5 kg) for 5 days after your procedure or as directed by your health care provider. °· Ask your health care provider when it is okay for you: °? To return to work or school. °? To resume usual physical activities or sports. °? To resume sexual activity. °Eating and drinking °· Eat a heart-healthy diet. This should include plenty of fresh fruits and vegetables. °· Avoid the following types of food: °? Food that is high in salt. °? Canned or highly processed food. °? Food that is high in saturated fat or sugar. °? Fried food. °· Limit alcohol intake to no more than 1 drink a day for non-pregnant women and 2 drinks a day for men. One drink equals 12 oz of beer, 5 oz of wine, or 1½ oz of hard liquor. °Lifestyle °· Do not use any products that contain nicotine or tobacco, such as cigarettes and e-cigarettes. If you need help quitting, ask your health care provider. °· Take steps to manage and control your weight. °· Get regular exercise. °· Manage your blood pressure. °· Manage other health problems, such as diabetes. °General instructions °· Take over-the-counter and prescription medicines only as told by your health care provider. Blood thinners may be prescribed after your procedure to improve blood flow through the stent. °· If you need an MRI after your heart stent has been placed, be   sure to tell the health care provider who orders the MRI that you have a heart stent.  Keep all follow-up visits as directed by your health care provider. This is important. Contact a health care provider if:  You have a fever.  You have chills.  You have increased bleeding from the catheter insertion area. Hold pressure on the area. Get help right away if:  You develop chest pain or shortness of breath.  You feel faint or you pass out.  You have unusual pain at the catheter insertion area.  You have redness,  warmth, or swelling at the catheter insertion area.  You have drainage (other than a small amount of blood on the dressing) from the catheter insertion area.  The catheter insertion area is bleeding, and the bleeding does not stop after 30 minutes of holding steady pressure on the area.  You develop bleeding from any other place, such as from your rectum. There may be bright red blood in your urine or stool, or it may appear as black, tarry stool. This information is not intended to replace advice given to you by your health care provider. Make sure you discuss any questions you have with your health care provider. Document Released: 01/27/2005 Document Revised: 04/06/2016 Document Reviewed: 04/06/2016 Elsevier Interactive Patient Education  2018 [place]. Heart Attack A heart attack (myocardial infarction, MI) causes damage to the heart that cannot be fixed. A heart attack often happens when a blood clot or other blockage cuts blood flow to the heart. When this happens, certain areas of the heart begin to die. This causes the pain you feel during a heart attack. Follow these instructions at home:  Take medicine as told by your doctor. You may need medicine to: ? Keep your blood from clotting too easily. ? Control your blood pressure. ? Lower your cholesterol. ? Control abnormal heart rhythms.  Change certain behaviors as told by your doctor. This may include: ? Quitting smoking. ? Being active. ? Eating a heart-healthy diet. Ask your doctor for help with this diet. ? Keeping a healthy weight. ? Keeping your diabetes under control. ? Lessening stress. ? Limiting how much alcohol you drink. Do not take these medicines unless your doctor says that you can:  Nonsteroidal anti-inflammatory drugs (NSAIDs). These include: ? Ibuprofen. ? Naproxen. ? Celecoxib.  Vitamin supplements that have vitamin A, vitamin E, or both.  Hormone therapy that contains estrogen with or without  progestin.  Get help right away if:  You have sudden chest discomfort.  You have sudden discomfort in your: ? Arms. ? Back. ? Neck. ? Jaw.  You have shortness of breath at any time.  You have sudden sweating or clammy skin.  You feel sick to your stomach (nauseous) or throw up (vomit).  You suddenly get light-headed or dizzy.  You feel your heart beating fast or skipping beats. These symptoms may be an emergency. Do not wait to see if the symptoms will go away. Get medical help right away. Call your local emergency services (911 in the [place] Do not drive yourself to the hospital. This information is not intended to replace advice given to you by your health care provider. Make sure you discuss any questions you have with your health care provider. Document Released: 01/09/2012 Document Revised: 12/16/2015 Document Reviewed: 09/12/2013 Elsevier Interactive Patient Education  2017 [employer].

## 2018-04-29 ENCOUNTER — Telehealth (HOSPITAL_COMMUNITY): Payer: Self-pay

## 2018-04-29 NOTE — Telephone Encounter (Signed)
Made initial call to patient in regards to Cardiac Rehab - Patient stated he is interested in the program. Patient informed me that he will not be seeing Dr.Tilley and he has been referred to Dr.Varanasi. Appt has not been scheduled yet. Informed patient that appt will need to be scheduled and completed before he gets scheduled. Patient verbalized understanding. Went over insurance with patient, he verbalized understanding.  Patients paperwork in no f/u appt folder.

## 2018-04-29 NOTE — Telephone Encounter (Signed)
Patients insurance is active and benefits verified through [facility] - $20.00 co-pay, no deductible, out of pocket amount of $4,000/$120.00 has been met, no co-insurance, and no pre-authorization is required. Passport/reference [id number]  Will make initial call to patient in regards to cardiac Rehab. If interested, patient will need to complete follow up appt. Once completed, patient will be contacted for scheduling.

## 2018-04-30 ENCOUNTER — Telehealth: Payer: Self-pay | Admitting: Physician Assistant

## 2018-04-30 NOTE — Telephone Encounter (Signed)
Patient contacted regarding discharge from [facility] on 04/28/18.  Patient understands to follow up with provider Melina Copa, PA on Monday Oct. 21 at 10:30 at [facility]. Patient understands discharge instructions? Yes Patient understands medications and regiment? Yes Patient understands to bring all medications to this visit? Yes  Patient asks if he should be taking HCTZ. He states someone called him and asked if he was taking it; states it was discussed by one of the doctors that saw him in the hospital but he was not discharged with it. I reviewed chart and note from Dr. Aundra Dubin on 10/5 talks about possibly starting HCTZ. Discharge on 10/6 does not mention HCTZ. I advised him to weigh himself daily and record weights. I advised he call prior to appointment with weight gain of 3 lb in 1 day or 5 lb in 1 week. I advised he also monitor daily BP and bring those recordings to appointment on 10/21. He verbalized understanding and agreement and thanked me for the call.

## 2018-04-30 NOTE — Telephone Encounter (Signed)
New Message   Patient has a TOC appt scheduled for 10/21 with Dayna Dunn.

## 2018-04-30 NOTE — Telephone Encounter (Signed)
Attempted to call the pt back for TCM follow-up.  Pts phone was busy.  Nursing to continue to follow-up for TCM call.

## 2018-05-12 ENCOUNTER — Encounter: Payer: Self-pay | Admitting: Physician Assistant

## 2018-05-12 NOTE — Progress Notes (Signed)
Cardiology Office Note    Date:  05/13/2018  ID:  Alex Herrera, DOB [birth year], MRN [id number] PCP:  Maury Dus, MD  Cardiologist:  Larae Grooms, MD   Chief Complaint: f/u MI  History of Present Illness:  Alex Herrera is a 67 y.o. male with history of CAD (remote PCI to RCA 2015, recent STEMI s/p DES to LAD x 2 with acute diastolic CHF), HTN, HLD who presents for post-hospital follow-up. He was previously followed by Dr. Wynonia Lawman. The patient presented to [facility] 04/26/18 with chest pain. Initial EKG nonacute, but repeat EKG with recurrent chest pain noted dramatic ST elevation and code STEMI was called. The patient was transferred to [facility] for urgent cath. This revealed a 99% pLAD and an 80% mLAD. The previously placed RCA was patent and there was no signifcant CFX or LM disease noted. He underwent PCI with DES x 2. His LVEDP was elevated and he was treated with IV Lasix x 1. Troponin peaked at 20. Echo done 04/27/18 showed preserved LVF with an EF of 55-60% but hypokinesis of mid anteroseptal myocardium; normal diastolic parameters. Labs otherwise showed normal CBC, K 3.9, Cr 1.12, LDL 71, no recent LFTs on file. He was on Livalo and Plavix prior to DC, sent home on Atorvastatin and Brilinta.   He returns for follow-up today with his wife. In general he has been doing well. He has had rare twinge of chest discomfort but unlike prior angina; he believes it is due to increased awareness watching for symptoms.  He has been gradually increasing activity and feeling well while doing so. After DC he did notice what sounds like a radial hematoma with a "lump" on his R wrist, but it has gone down significantly and bruising is improving. There is still some tenseness in the wrist but no numbness or tingling. It sounds like he continues to use this arm quite a bit. He inquires about flu and PNA shot today. He previously had intense myalgias with Crestor and although the Lipitor has been better, he  still has some days he's noticed this. 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   Past Medical History:  Diagnosis Date  . Acute CHF (congestive heart failure) ([place])    a. presumed diastolic LVEDP 39 during cath 04/2018.  Marland Kitchen Allergic rhinitis   . Arthritis   . Coronary artery disease    a. PCI to RCA 2015. b. STEMI 04/2018 s/p DESx2 to LAD, EF 55-60%.  . Hearing loss   . History of kidney stones   . Hyperlipidemia   . Hypertension   . Skin cancer, basal cell 2000    Past Surgical History:  Procedure Laterality Date  . CARDIAC CATHETERIZATION    . COLONOSCOPY    . CORONARY STENT INTERVENTION N/A 04/26/2018   Procedure: CORONARY STENT INTERVENTION;  Surgeon: Jettie Booze, MD;  Location: [facility];  Service: Cardiovascular;  Laterality: N/A;  . LEFT HEART CATH AND CORONARY ANGIOGRAPHY N/A 04/26/2018   Procedure: LEFT HEART CATH AND CORONARY ANGIOGRAPHY;  Surgeon: Jettie Booze, MD;  Location: [facility];  Service: Cardiovascular;  Laterality: N/A;  . LEFT HEART CATHETERIZATION WITH CORONARY ANGIOGRAM N/A 01/22/2014   Procedure: LEFT HEART CATHETERIZATION WITH CORONARY ANGIOGRAM;  Surgeon: Jacolyn Reedy, MD;  Location: [facility] C[facility] LAB;  Service: Cardiovascular;  Laterality: N/A;  . NASAL SEPTUM SURGERY     1970's  . PERCUTANEOUS CORONARY STENT INTERVENTION (PCI-S)  01/22/2014   Procedure: PERCUTANEOUS CORONARY  STENT INTERVENTION (PCI-S);  Surgeon: Jacolyn Reedy, MD;  Location: [facility] C[facility] LAB;  Service: Cardiovascular;;  . PTCA  01/22/2014   PTCA/DES 1 PROXIMAL RCA           DR TILLEY  . VASECTOMY      Current Medications: Current Meds  Medication Sig  . acetaminophen (TYLENOL) 325 MG tablet Take 2 tablets (650 mg total) by mouth every 4 (four) hours as needed for headache or mild pain.  Marland Kitchen aspirin 81 MG chewable tablet Chew 1 tablet (81 mg total) by mouth daily.  Marland Kitchen atorvastatin (LIPITOR) 80 MG tablet Take 1 tablet (80 mg total) by mouth daily at 6 PM.   . Azilsartan Medoxomil (EDARBI) 80 MG TABS Take 80 mg by mouth daily.   . Coenzyme Q10 (COQ10 PO) Take 1 capsule by mouth daily.  . fluticasone (FLONASE) 50 MCG/ACT nasal spray Place 2 sprays into both nostrils daily as needed (seasonal allergies).   . gabapentin (NEURONTIN) 300 MG capsule Take 300 mg by mouth at bedtime.  Marland Kitchen GLUCOSAMINE-CHONDROITIN PO Take 2 tablets by mouth daily.  . metoprolol succinate (TOPROL-XL) 25 MG 24 hr tablet Take 25 mg by mouth at bedtime.   . mometasone (ELOCON) 0.1 % cream Apply 1 application topically daily as needed (wound care).  . Saw Palmetto, Serenoa repens, (SAW PALMETTO PO) Take 2 capsules by mouth daily.  . ticagrelor (BRILINTA) 90 MG TABS tablet Take 1 tablet (90 mg total) by mouth 2 (two) times daily.  . valACYclovir (VALTREX) 1000 MG tablet Take 1,000 mg by mouth daily.       Allergies:   Other and Penicillins   Social History   Socioeconomic History  . Marital status: Married    Spouse name: Not on file  . Number of children: 2  . Years of education: Not on file  . Highest education level: Not on file  Occupational History  . Occupation: Self Employed  Social Needs  . Financial resource strain: Not on file  . Food insecurity:    Worry: Not on file    Inability: Not on file  . Transportation needs:    Medical: Not on file    Non-medical: Not on file  Tobacco Use  . Smoking status: Never Smoker  . Smokeless tobacco: Never Used  Substance and Sexual Activity  . Alcohol use: No    Alcohol/week: 0.0 standard drinks    Comment: social  . Drug use: No  . Sexual activity: Not on file  Lifestyle  . Physical activity:    Days per week: Not on file    Minutes per session: Not on file  . Stress: Not on file  Relationships  . Social connections:    Talks on phone: Not on file    Gets together: Not on file    Attends religious service: Not on file    Active member of club or organization: Not on file    Attends meetings of clubs or  organizations: Not on file    Relationship status: Not on file  Other Topics Concern  . Not on file  Social History Narrative  . Not on file     Family History:  The patient's family history includes COPD in his father; Diabetes in his mother; Heart attack in his brother; Hypertension in his father and mother.  ROS:   Please see the history of present illness.  All other systems are reviewed and otherwise negative.    PHYSICAL EXAM:  VS:  BP 140/68   Pulse 74   Ht 5\' 10"  (1.778 m)   Wt 204 lb 12.8 oz (92.9 kg)   SpO2 95%   BMI 29.39 kg/m   BMI: Body mass index is 29.39 kg/m. GEN: Well nourished, well developed WM, in no acute distress HEENT: normocephalic, atraumatic Neck: no JVD, carotid bruits, or masses Cardiac: RRR; no murmurs, rubs, or gallops, no edema  Respiratory:  clear to auscultation bilaterally, normal work of breathing GI: soft, nontender, nondistended, + BS MS: no deformity or atrophy Skin: warm and dry, no rash. Right radial cath site with mild resolving ecchymosis and some swelling at wrist, good pulse - good capillary refill and sensation. No bruit. Neuro:  Alert and Oriented x 3, Strength and sensation are intact, follows commands Psych: euthymic mood, full affect  Wt Readings from Last 3 Encounters:  05/13/18 204 lb 12.8 oz (92.9 kg)  04/28/18 199 lb 8.3 oz (90.5 kg)  08/23/15 199 lb (90.3 kg)      Studies/Labs Reviewed:   EKG:  EKG was ordered today and personally reviewed by me and demonstrates NSR 66bpm, cannot exclude prior inferior infarct, no acute changes.  Recent Labs: 04/28/2018: BUN 16; Creatinine, Ser 1.12; Hemoglobin 15.1; Platelets 204; Potassium 3.9; Sodium 138   Lipid Panel    Component Value Date/Time   CHOL 146 04/26/2018 1313   TRIG 92 04/26/2018 1313   HDL 57 04/26/2018 1313   CHOLHDL 2.6 04/26/2018 1313   VLDL 18 04/26/2018 1313   LDLCALC 71 04/26/2018 1313    Additional studies/ records that were reviewed today  include: Summarized above.   ASSESSMENT & PLAN:   1. CAD with recent MI - doing well post PCI. Continue ASA, statin, Brilinta. See below re: BB. We discussed avoidance of saw palmetto as it can increase bleeding risk while on anticoagulants by some reports. Discussed radial site with DOD Dr. Saunders Revel. Given evidence of normal perfusion with normal pulse, no parasthesias, no bruit and improving symptoms, no acute intervention needed except supportive care which I discussed today. He will notify us of any worsening/change. 2. Recent acute CHF - appears euvolemic on exam. 3. Essential HTN - while in the hospital was on carvedilol and spironolactone which I believe was in anticipation that EF might be low. This was normal, but BP is suboptimally controlled. Will switch Toprol to carvedilol 6.25mg  BID. The patient was instructed to monitor their blood pressure at home and to call if tending to run higher than 130/80. 4. Hyperlipidemia - he inquired about PCSK9 inhibitors. He is tolerating atorvastatin better than Crestor but has noticed some recurrent myalgias. Will cut dose to 40mg  daily (1/2 tablet) and recommend to get repeat labs when he returns for 6 week follow-up. If unable to tolerate statin or LDL not at goal, will refer to pharmD.  Disposition: F/u with Dr. Roney Marion team in 6 weeks. Advised to see PCP for flu/PNA shot as we do not presently have these in stock per office report.   Medication Adjustments/Labs and Tests Ordered: Current medicines are reviewed at length with the patient today.  Concerns regarding medicines are outlined above. Medication changes, Labs and Tests ordered today are summarized above and listed in the Patient Instructions accessible in Encounters.   Signed, Charlie Pitter, PA-C  05/13/2018 11:02 AM    [facility] [place], [place], [place]  [address] Phone: [phone]; Fax: [phone]

## 2018-05-13 ENCOUNTER — Ambulatory Visit: Payer: Medicare Other | Admitting: Physician Assistant

## 2018-05-13 ENCOUNTER — Encounter: Payer: Self-pay | Admitting: Physician Assistant

## 2018-05-13 ENCOUNTER — Telehealth: Payer: Self-pay | Admitting: Physician Assistant

## 2018-05-13 VITALS — BP 140/68 | HR 74 | Ht 70.0 in | Wt 204.8 lb

## 2018-05-13 DIAGNOSIS — I10 Essential (primary) hypertension: Secondary | ICD-10-CM | POA: Diagnosis not present

## 2018-05-13 DIAGNOSIS — I2510 Atherosclerotic heart disease of native coronary artery without angina pectoris: Secondary | ICD-10-CM

## 2018-05-13 DIAGNOSIS — Z8679 Personal history of other diseases of the circulatory system: Secondary | ICD-10-CM

## 2018-05-13 DIAGNOSIS — E785 Hyperlipidemia, unspecified: Secondary | ICD-10-CM | POA: Diagnosis not present

## 2018-05-13 MED ORDER — ATORVASTATIN CALCIUM 80 MG PO TABS: 40.0000 mg | ORAL_TABLET | Freq: Every day | ORAL | 3 refills | Status: DC

## 2018-05-13 MED ORDER — CARVEDILOL 6.25 MG PO TABS: 6.2500 mg | ORAL_TABLET | Freq: Two times a day (BID) | ORAL | 3 refills | Status: DC

## 2018-05-13 NOTE — Telephone Encounter (Signed)
Returned pts call and he has been made aware that he is to continue with his Aspirin 81 mg daily. Pt verbalized understanding and thanked me for the quick response.

## 2018-05-13 NOTE — Patient Instructions (Signed)
Medication Instructions:  Your physician has recommended you make the following change in your medication:  1.  STOP the Toprol 2.  START Carvedilol 6.25 taking 1 tablet by mouth twice a day 3.  DECREASE Atorvastatin 80 to 1/2 tablet daily  If you need a refill on your cardiac medications before your next appointment, please call your pharmacy.   Lab work: None ordered  If you have labs (blood work) drawn today and your tests are completely normal, you will receive your results only by: Marland Kitchen MyChart Message (if you have MyChart) OR . A paper copy in the mail If you have any lab test that is abnormal or we need to change your treatment, we will call you to review the results.  Testing/Procedures: None ordered  Follow-Up: At [facility], you and your health needs are our priority.  As part of our continuing mission to provide you with exceptional heart care, we have created designated Provider Care Teams.  These Care Teams include your primary Cardiologist (physician) and Advanced Practice Providers (APPs -  Physician Assistants and Nurse Practitioners) who all work together to provide you with the care you need, when you need it. You will need a follow up appointment in [place] DR. VARANASI OR DAYNA DUNN, PA-C ( COME FASTING TO THIS APPT SO WE CAN GET SOME LAB WORK)  Any Other Special Instructions Will Be Listed Below (If Applicable).   --  Go to your primary care dr for the pnuemonia and flu shot --  Monitor your blood pressure.  If you notice that it is running over 130/80 2-3 days after the med change, please call our office. --  Call [phone] and ask for the Office of Patient Experience

## 2018-05-13 NOTE — Telephone Encounter (Signed)
New Message:    Pt was here earlier, she forgot to ask a question. Pt wants to know since his blood pressure medicine is being changed,does he need to still taking his Aspirin?

## 2018-05-28 ENCOUNTER — Telehealth (HOSPITAL_COMMUNITY): Payer: Self-pay

## 2018-05-28 NOTE — Telephone Encounter (Signed)
Patient had his follow up appt on 05/13/18, will pass to RN for medical review.

## 2018-05-29 ENCOUNTER — Telehealth (HOSPITAL_COMMUNITY): Payer: Self-pay

## 2018-05-29 NOTE — Telephone Encounter (Signed)
Called patient to see if he was interested in participating in the Cardiac Rehab Program. Patient stated yes. Patient will come in for orientation on 07/25/2018 @ 8:15AM and will attend the 9:45AM exercise class.  Mailed homework package.

## 2018-06-26 NOTE — Progress Notes (Signed)
Cardiology Office Note    Date:  06/27/2018  ID:  Alex Herrera, DOB Oct 05, 1950, MRN [id number] PCP:  Maury Dus, MD  Cardiologist:  Larae Grooms, MD   Chief Complaint: f/u CAD  History of Present Illness:  Alex Herrera is a 67 y.o. male with history of CAD (remote PCI to RCA 2015, STEMI 04/2018 s/p DES to LAD x 2 with acute diastolic CHF), HTN, HLD who presents for f/u of CAD. He was previously followed by Dr. Wynonia Lawman. The patient presented to [facility] 04/26/18 with chest pain. Initial EKG nonacute, but repeat EKG with recurrent chest pain noted dramatic ST elevation and code STEMI was called. He unerwent urgent cath showing 99% pLAD and an 80% mLAD. The previously placed RCA was patent and there was no signifcant CFX or LM disease noted. He underwent PCI with DES x 2. His LVEDP was elevated and he was treated with IV Lasix x 1. Troponin peaked at 20. Echo 04/27/18 showed preserved LVF with an EF of 55-60% but hypokinesis of mid anteroseptal myocardium; normal diastolic parameters. He was on Livalo and Plavix prior to DC, sent home on Atorvastatin and Brilinta. 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 He has history of myalgias with Crestor. At f/u 05/13/18 BP was elevated so metoprolol was switched to carvedilol. Recent labs otherwise showed normal CBC, K 3.9, Cr 1.12, LDL 71, no recent LFTs on file.   He returns for follow-up feeling well. He has returned to regular physical activity without functional limitation. He does report intermittently when he lays down to sleep at night he gets SOB lying on his back. It does not occur if he is up and moving around for the day. He wonders if it is Brilinta. He reports home BPs are generally <130 with occasional outlier of 762 systolic. No edema, weight gain, chest pain, palpitations. Arm hematoma has resolved. He does notice a longer time to stop bleeding if he cuts himself shaving and wonders if he can cut down his aspirin to every other  day. He has not had any hemodynamically significant bleeding or unprovoked bleeding like GI or urinary bleeding.  Past Medical History:  Diagnosis Date  . Acute CHF (congestive heart failure) ([place])    a. presumed diastolic LVEDP 39 during cath 04/2018.  Marland Kitchen Allergic rhinitis   . Arthritis   . Coronary artery disease    a. PCI to RCA 2015. b. STEMI 04/2018 s/p DESx2 to LAD, EF 55-60%.  . Hearing loss   . History of kidney stones   . Hyperlipidemia   . Hypertension   . Skin cancer, basal cell 2000    Past Surgical History:  Procedure Laterality Date  . CARDIAC CATHETERIZATION    . COLONOSCOPY    . CORONARY STENT INTERVENTION N/A 04/26/2018   Procedure: CORONARY STENT INTERVENTION;  Surgeon: Jettie Booze, MD;  Location: [facility];  Service: Cardiovascular;  Laterality: N/A;  . LEFT HEART CATH AND CORONARY ANGIOGRAPHY N/A 04/26/2018   Procedure: LEFT HEART CATH AND CORONARY ANGIOGRAPHY;  Surgeon: Jettie Booze, MD;  Location: [facility];  Service: Cardiovascular;  Laterality: N/A;  . LEFT HEART CATHETERIZATION WITH CORONARY ANGIOGRAM N/A 01/22/2014   Procedure: LEFT HEART CATHETERIZATION WITH CORONARY ANGIOGRAM;  Surgeon: Jacolyn Reedy, MD;  Location: [facility] C[facility];  Service: Cardiovascular;  Laterality: N/A;  . NASAL SEPTUM SURGERY     1970's  . PERCUTANEOUS CORONARY STENT INTERVENTION (PCI-S)  01/22/2014   Procedure: PERCUTANEOUS CORONARY  STENT INTERVENTION (PCI-S);  Surgeon: Jacolyn Reedy, MD;  Location: [facility] C[facility] LAB;  Service: Cardiovascular;;  . PTCA  01/22/2014   PTCA/DES 1 PROXIMAL RCA           DR TILLEY  . VASECTOMY      Current Medications: Current Meds  Medication Sig  . acetaminophen (TYLENOL) 325 MG tablet Take 2 tablets (650 mg total) by mouth every 4 (four) hours as needed for headache or mild pain.  Marland Kitchen aspirin 81 MG chewable tablet Chew 1 tablet (81 mg total) by mouth daily.  Marland Kitchen atorvastatin (LIPITOR) 80 MG tablet Take 0.5 tablets (40 mg  total) by mouth daily.  . Azilsartan Medoxomil (EDARBI) 80 MG TABS Take 80 mg by mouth daily.   . carvedilol (COREG) 6.25 MG tablet Take 1 tablet (6.25 mg total) by mouth 2 (two) times daily.  . Coenzyme Q10 (COQ10 PO) Take 1 capsule by mouth daily.  . fluticasone (FLONASE) 50 MCG/ACT nasal spray Place 2 sprays into both nostrils daily as needed (seasonal allergies).   . gabapentin (NEURONTIN) 300 MG capsule Take 300 mg by mouth at bedtime.  Marland Kitchen GLUCOSAMINE-CHONDROITIN PO Take 2 tablets by mouth daily.  . mometasone (ELOCON) 0.1 % cream Apply 1 application topically daily as needed (wound care).  . Saw Palmetto, Serenoa repens, (SAW PALMETTO PO) Take 2 capsules by mouth daily.  . ticagrelor (BRILINTA) 90 MG TABS tablet Take 1 tablet (90 mg total) by mouth 2 (two) times daily.  . valACYclovir (VALTREX) 1000 MG tablet Take 1,000 mg by mouth daily.     Allergies:   Other; Penicillins; and Crestor [rosuvastatin calcium]   Social History   Socioeconomic History  . Marital status: Married    Spouse name: Not on file  . Number of children: 2  . Years of education: Not on file  . Highest education level: Not on file  Occupational History  . Occupation: Self Employed  Social Needs  . Financial resource strain: Not on file  . Food insecurity:    Worry: Not on file    Inability: Not on file  . Transportation needs:    Medical: Not on file    Non-medical: Not on file  Tobacco Use  . Smoking status: Never Smoker  . Smokeless tobacco: Never Used  Substance and Sexual Activity  . Alcohol use: No    Alcohol/week: 0.0 standard drinks    Comment: social  . Drug use: No  . Sexual activity: Not on file  Lifestyle  . Physical activity:    Days per week: Not on file    Minutes per session: Not on file  . Stress: Not on file  Relationships  . Social connections:    Talks on phone: Not on file    Gets together: Not on file    Attends religious service: Not on file    Active member of club  or organization: Not on file    Attends meetings of clubs or organizations: Not on file    Relationship status: Not on file  Other Topics Concern  . Not on file  Social History Narrative  . Not on file     Family History:  The patient's family history includes COPD in his father; Diabetes in his mother; Heart attack in his brother; Hypertension in his father and mother.  ROS:   Please see the history of present illness.  All other systems are reviewed and otherwise negative.    PHYSICAL EXAM:   VS:  BP 130/72   Pulse 61   Ht 5\' 10"  (1.778 m)   Wt 203 lb 6.4 oz (92.3 kg)   SpO2 97%   BMI 29.18 kg/m   BMI: Body mass index is 29.18 kg/m. GEN: Well nourished, well developed WM, in no acute distress HEENT: normocephalic, atraumatic Neck: no JVD, carotid bruits, or masses Cardiac:  RRR; no murmurs, rubs, or gallops, no edema  Respiratory:  clear to auscultation bilaterally, normal work of breathing GI: soft, nontender, nondistended, + BS MS: no deformity or atrophy Skin: warm and dry, no rash Neuro:  Alert and Oriented x 3, Strength and sensation are intact, follows commands Psych: euthymic mood, full affect  Wt Readings from Last 3 Encounters:  06/27/18 203 lb 6.4 oz (92.3 kg)  05/13/18 204 lb 12.8 oz (92.9 kg)  04/28/18 199 lb 8.3 oz (90.5 kg)      Studies/Labs Reviewed:   EKG:  EKG was not ordered today.  Recent Labs: 04/28/2018: BUN 16; Creatinine, Ser 1.12; Hemoglobin 15.1; Platelets 204; Potassium 3.9; Sodium 138   Lipid Panel    Component Value Date/Time   CHOL 146 04/26/2018 1313   TRIG 92 04/26/2018 1313   HDL 57 04/26/2018 1313   CHOLHDL 2.6 04/26/2018 1313   VLDL 18 04/26/2018 1313   LDLCALC 71 04/26/2018 1313    Additional studies/ records that were reviewed today include: Summarized above  ASSESSMENT & PLAN:   1. CAD with recent MI - discussed reason to continue aspirin daily instead of decreasing dose for minor bleeding. We also discussed  holding pressure for longer periods of time and avoiding dull razors. He has not had any hemodynamically significant bleeding. Since we are checking other labs today, will check CBC to trend. He does complain of orthopnea and wonders if it's Brilinta. Will check BNP and Cr with consideration to add low dose diuretic trial given h/o acute diastolic CHF at time of MI. If BNP is completely normal will consider discussion of changing Brilinta to Effient with Dr. Irish Lack. Otherwise doing well. 2. Essential HTN - BP upper limits of normal but reports control at home. Follow in context of the above. 3. Hyperlipidemia - he initially planned to decrease atorvastatin to 40mg  at last visit but in thinking back felt his myalgias were no different off the statin the last few years compared to on a statin. He would like to continue current dose of 80mg . Update CMET today. 4. Ischemic cardiomyopathy with orthopnea - as above, check BNP. Discussed sodium restriction. Continue BP control and surveillance for worsening sx.  Disposition: F/u with Dr. Irish Lack in 4 months.  Medication Adjustments/Labs and Tests Ordered: Current medicines are reviewed at length with the patient today.  Concerns regarding medicines are outlined above. Medication changes, Labs and Tests ordered today are summarized above and listed in the Patient Instructions accessible in Encounters.   Signed, Alex Pitter, PA-C  06/27/2018 8:56 AM    [facility] [address], [place], [place]  [address] Phone: [phone]; Fax: [phone]

## 2018-06-27 ENCOUNTER — Encounter: Payer: Self-pay | Admitting: Physician Assistant

## 2018-06-27 ENCOUNTER — Other Ambulatory Visit: Payer: Self-pay | Admitting: Physician Assistant

## 2018-06-27 ENCOUNTER — Ambulatory Visit: Payer: Medicare Other | Admitting: Physician Assistant

## 2018-06-27 VITALS — BP 130/72 | HR 61 | Ht 70.0 in | Wt 203.4 lb

## 2018-06-27 DIAGNOSIS — I255 Ischemic cardiomyopathy: Secondary | ICD-10-CM | POA: Diagnosis not present

## 2018-06-27 DIAGNOSIS — I10 Essential (primary) hypertension: Secondary | ICD-10-CM | POA: Diagnosis not present

## 2018-06-27 DIAGNOSIS — E785 Hyperlipidemia, unspecified: Secondary | ICD-10-CM

## 2018-06-27 DIAGNOSIS — R0601 Orthopnea: Secondary | ICD-10-CM

## 2018-06-27 DIAGNOSIS — I2510 Atherosclerotic heart disease of native coronary artery without angina pectoris: Secondary | ICD-10-CM

## 2018-06-27 LAB — COMPREHENSIVE METABOLIC PANEL
ALBUMIN: 4 g/dL (ref 3.6–4.8)
ALT: 25 IU/L (ref 0–44)
AST: 22 IU/L (ref 0–40)
Albumin/Globulin Ratio: 1.8 (ref 1.2–2.2)
Alkaline Phosphatase: 102 IU/L (ref 39–117)
BUN/Creatinine Ratio: 13 (ref 10–24)
BUN: 15 mg/dL (ref 8–27)
Bilirubin Total: 0.5 mg/dL (ref 0.0–1.2)
CO2: 21 mmol/L (ref 20–29)
Calcium: 9.3 mg/dL (ref 8.6–10.2)
Chloride: 106 mmol/L (ref 96–106)
Creatinine, Ser: 1.12 mg/dL (ref 0.76–1.27)
GFR calc Af Amer: 78 mL/min/{1.73_m2} (ref >59)
GFR, EST NON AFRICAN AMERICAN: 67 mL/min/{1.73_m2} (ref >59)
Globulin, Total: 2.2 g/dL (ref 1.5–4.5)
Glucose: 111 mg/dL — ABNORMAL HIGH (ref 65–99)
POTASSIUM: 4.3 mmol/L (ref 3.5–5.2)
Sodium: 141 mmol/L (ref 134–144)
TOTAL PROTEIN: 6.2 g/dL (ref 6.0–8.5)

## 2018-06-27 LAB — CBC
HEMOGLOBIN: 14.6 g/dL (ref 13.0–17.7)
Hematocrit: 43.9 % (ref 37.5–51.0)
MCH: 30.7 pg (ref 26.6–33.0)
MCHC: 33.3 g/dL (ref 31.5–35.7)
MCV: 92 fL (ref 79–97)
Platelets: 219 10*3/uL (ref 150–450)
RBC: 4.76 x10E6/uL (ref 4.14–5.80)
RDW: 13.1 % (ref 12.3–15.4)
WBC: 5.7 10*3/uL (ref 3.4–10.8)

## 2018-06-27 LAB — PRO B NATRIURETIC PEPTIDE: NT-PRO BNP: 107 pg/mL (ref 0–376)

## 2018-06-27 NOTE — Progress Notes (Signed)
Pt has been made aware of normal result and verbalized understanding.  jw 06/27/18

## 2018-06-27 NOTE — Patient Instructions (Addendum)
Medication Instructions:  Your physician recommends that you continue on your current medications as directed. Please refer to the Current Medication list given to you today.  If you need a refill on your cardiac medications before your next appointment, please call your pharmacy.   Lab work: TODAY:  CMET, CBC, & PRO BNP  If you have labs (blood work) drawn today and your tests are completely normal, you will receive your results only by: Marland Kitchen MyChart Message (if you have MyChart) OR . A paper copy in the mail If you have any lab test that is abnormal or we need to change your treatment, we will call you to review the results.  Testing/Procedures: None ordered  Follow-Up: At [facility], you and your health needs are our priority.  As part of our continuing mission to provide you with exceptional heart care, we have created designated Provider Care Teams.  These Care Teams include your primary Cardiologist (physician) and Advanced Practice Providers (APPs -  Physician Assistants and Nurse Practitioners) who all work together to provide you with the care you need, when you need it. You will need a follow up appointment in 4 months.  Please call our office 2 months in advance to schedule this appointment.  You may see Larae Grooms, MD or one of the following Advanced Practice Providers on your designated Care Team:   Coleman, PA-C Melina Copa, PA-C . Ermalinda Barrios, PA-C  Any Other Special Instructions Will Be Listed Below (If Applicable).

## 2018-06-28 ENCOUNTER — Telehealth: Payer: Self-pay | Admitting: *Deleted

## 2018-06-28 MED ORDER — PRASUGREL HCL 10 MG PO TABS: ORAL_TABLET | ORAL | 3 refills | Status: DC

## 2018-06-28 NOTE — Telephone Encounter (Signed)
Pt has been notified of lab results and recommendations to take Brilinta through tonight. Tomorrow start Effient. Pt aware first dose of Effient tomorrow Saturday 12/7 morning will be 60 mg, then beginning on Sunday 12/8 he will go to a regular dosage of Effient 10 mg once a day. Pt advised if he is still having sob a few days after making the change in medication to please call the office. Pt thanked me for the call.

## 2018-06-28 NOTE — Telephone Encounter (Signed)
-----   Message from Jeanann Lewandowsky, [place] sent at 06/28/2018  8:10 AM EST ----- thanks

## 2018-07-22 ENCOUNTER — Telehealth (HOSPITAL_COMMUNITY): Payer: Self-pay | Admitting: Pharmacist

## 2018-07-22 NOTE — Progress Notes (Signed)
Alex Herrera 67 y.o. male DOB 09-19-1950 MRN [id number]       Nutrition No diagnosis found. Past Medical History:  Diagnosis Date  . Acute CHF (congestive heart failure) ([place])    a. presumed diastolic LVEDP 39 during cath 04/2018.  Marland Kitchen Allergic rhinitis   . Arthritis   . Coronary artery disease    a. PCI to RCA 2015. b. STEMI 04/2018 s/p DESx2 to LAD, EF 55-60%.  . Hearing loss   . History of kidney stones   . Hyperlipidemia   . Hypertension   . Skin cancer, basal cell 2000   Meds reviewed.    Current Outpatient Medications (Cardiovascular):  .  atorvastatin (LIPITOR) 80 MG tablet, Take 80 mg by mouth daily. .  Azilsartan Medoxomil (EDARBI) 80 MG TABS, Take 80 mg by mouth daily.  .  carvedilol (COREG) 6.25 MG tablet, Take 1 tablet (6.25 mg total) by mouth 2 (two) times daily.  Current Outpatient Medications (Respiratory):  .  fluticasone (FLONASE) 50 MCG/ACT nasal spray, Place 2 sprays into both nostrils daily as needed (seasonal allergies).   Current Outpatient Medications (Analgesics):  .  acetaminophen (TYLENOL) 325 MG tablet, Take 2 tablets (650 mg total) by mouth every 4 (four) hours as needed for headache or mild pain. Marland Kitchen  aspirin 81 MG chewable tablet, Chew 1 tablet (81 mg total) by mouth daily.  Current Outpatient Medications (Hematological):  .  prasugrel (EFFIENT) 10 MG TABS tablet, Take 60 mg on first dose tomorrow 06/29/18. Beginning on 06/30/18 start taking 1 tablet daily  Current Outpatient Medications (Other):  Marland Kitchen  Coenzyme Q10 (COQ10 PO), Take 1 capsule by mouth daily. Marland Kitchen  gabapentin (NEURONTIN) 300 MG capsule, Take 300 mg by mouth at bedtime. Marland Kitchen  GLUCOSAMINE-CHONDROITIN PO, Take 2 tablets by mouth daily. .  mometasone (ELOCON) 0.1 % cream, Apply 1 application topically daily as needed (wound care). .  Saw Palmetto, Serenoa repens, (SAW PALMETTO PO), Take 2 capsules by mouth daily. .  valACYclovir (VALTREX) 1000 MG tablet, Take 1,000 mg by mouth daily.     HT: Ht Readings from Last 1 Encounters:  06/27/18 5\' 10"  (1.778 m)    WT: Wt Readings from Last 5 Encounters:  06/27/18 203 lb 6.4 oz (92.3 kg)  05/13/18 204 lb 12.8 oz (92.9 kg)  04/28/18 199 lb 8.3 oz (90.5 kg)  08/23/15 199 lb (90.3 kg)  05/17/15 199 lb 12.8 oz (90.6 kg)      BMI = 29.18 (06/27/18)  Current tobacco use? No       Labs:  Lipid Panel     Component Value Date/Time   CHOL 146 04/26/2018 1313   TRIG 92 04/26/2018 1313   HDL 57 04/26/2018 1313   CHOLHDL 2.6 04/26/2018 1313   VLDL 18 04/26/2018 1313   LDLCALC 71 04/26/2018 1313    No results found for: HGBA1C CBG (last 3)  No results for input(s): GLUCAP in the last 72 hours.  Nutrition Diagnosis ? Food-and nutrition-related knowledge deficit related to lack of exposure to information as related to diagnosis of: ? CVD  ? Overweight  related to excessive energy intake as evidenced by a  BMI = 29.18 (06/27/18)  Nutrition Goal(s):  ? To be determined  Plan:  Pt to attend nutrition classes ? Nutrition I ? Nutrition II ? Portion Distortion  ? Diabetes Blitz ? Diabetes Q & A Will provide client-centered nutrition education as part of interdisciplinary care.   Monitor and evaluate progress toward nutrition goal with team.  Laurina Bustle, MS, RD, LDN 07/22/2018 11:41 AM

## 2018-07-23 NOTE — Telephone Encounter (Signed)
Cardiac Rehab Medication Review by a Pharmacist  Does the patient  feel that his/her medications are working for him/her?  yes  Has the patient been experiencing any side effects to the medications prescribed?  no  Does the patient measure his/her own blood pressure or blood glucose at home?  No, checks BP periodically and remembers that he has been ~140/70   Does the patient have any problems obtaining medications due to transportation or finances?   no  Understanding of regimen: good Understanding of indications: good Potential of compliance: good    Pharmacist comments: Patient with good understanding of medication regimen.   Janae Bridgeman, PharmD PGY1 Pharmacy Resident Phone: [phone] 07/23/2018 1:29 PM

## 2018-07-25 ENCOUNTER — Encounter (HOSPITAL_COMMUNITY)
Admission: RE | Admit: 2018-07-25 | Discharge: 2018-07-25 | Disposition: A | Discharge status: Home / Self Care | Payer: Medicare Other | Source: Ambulatory Visit | Attending: Interventional Cardiology | Admitting: Interventional Cardiology

## 2018-07-25 ENCOUNTER — Encounter (HOSPITAL_COMMUNITY): Payer: Self-pay

## 2018-07-25 VITALS — BP 112/64 | HR 51 | Ht 69.75 in | Wt 205.5 lb

## 2018-07-25 DIAGNOSIS — I2102 ST elevation (STEMI) myocardial infarction involving left anterior descending coronary artery: Secondary | ICD-10-CM | POA: Insufficient documentation

## 2018-07-25 DIAGNOSIS — Z955 Presence of coronary angioplasty implant and graft: Secondary | ICD-10-CM | POA: Diagnosis present

## 2018-07-25 NOTE — Progress Notes (Signed)
Cardiac Individual Treatment Plan  Patient Details  Name: Alex Herrera MRN: [id number] Date of Birth: 06/14/1951 Referring Provider:     [place] from 07/25/2018 in [place]  Referring Provider  Larae Grooms MD       Initial Encounter Date:    CARDIAC REHAB [place] from 07/25/2018 in [place]  Date  07/25/18      Visit Diagnosis: ST elevation myocardial infarction involving left anterior descending (LAD) coronary artery [facility])  Status post coronary artery stent placement  Patient's Home Medications on Admission:  Current Outpatient Medications:  .  acetaminophen (TYLENOL) 325 MG tablet, Take 2 tablets (650 mg total) by mouth every 4 (four) hours as needed for headache or mild pain., Disp: , Rfl:  .  aspirin 81 MG chewable tablet, Chew 1 tablet (81 mg total) by mouth daily., Disp: , Rfl:  .  atorvastatin (LIPITOR) 80 MG tablet, Take 80 mg by mouth daily., Disp: , Rfl:  .  Azilsartan Medoxomil (EDARBI) 80 MG TABS, Take 80 mg by mouth daily. , Disp: , Rfl:  .  carvedilol (COREG) 6.25 MG tablet, Take 1 tablet (6.25 mg total) by mouth 2 (two) times daily., Disp: 180 tablet, Rfl: 3 .  Coenzyme Q10 (COQ10 PO), Take 1 capsule by mouth daily., Disp: , Rfl:  .  fluticasone (FLONASE) 50 MCG/ACT nasal spray, Place 2 sprays into both nostrils daily as needed (seasonal allergies). , Disp: , Rfl:  .  gabapentin (NEURONTIN) 300 MG capsule, Take 300 mg by mouth at bedtime., Disp: , Rfl:  .  GLUCOSAMINE-CHONDROITIN PO, Take 2 tablets by mouth daily., Disp: , Rfl:  .  mometasone (ELOCON) 0.1 % cream, Apply 1 application topically daily as needed (wound care)., Disp: , Rfl:  .  prasugrel (EFFIENT) 10 MG TABS tablet, Take 60 mg on first dose tomorrow 06/29/18. Beginning on 06/30/18 start taking 1 tablet daily, Disp: 100 tablet, Rfl: 3 .  Saw Palmetto, Serenoa repens, (SAW PALMETTO PO), Take 2  capsules by mouth daily., Disp: , Rfl:  .  valACYclovir (VALTREX) 1000 MG tablet, Take 1,000 mg by mouth daily. , Disp: , Rfl:   Past Medical History: Past Medical History:  Diagnosis Date  . Acute CHF (congestive heart failure) ([place])    a. presumed diastolic LVEDP 39 during cath 04/2018.  [place] Allergic rhinitis   . Arthritis   . Coronary artery disease    a. PCI to RCA 2015. b. STEMI 04/2018 s/p DESx2 to LAD, EF 55-60%.  . Hearing loss   . History of kidney stones   . Hyperlipidemia   . Hypertension   . Skin cancer, basal cell 2000    Tobacco Use: Social History   Tobacco Use  Smoking Status Never Smoker  Smokeless Tobacco Never Used    Labs: Recent Review Scientist, physiological    Labs for ITP Cardiac and Pulmonary Rehab Latest Ref Rng & Units 04/26/2018   Cholestrol 0 - 200 mg/dL 146   LDLCALC 0 - 99 mg/dL 71   HDL >40 mg/dL 57   Trlycerides <150 mg/dL 92      Capillary Blood Glucose: No results found for: GLUCAP   Exercise Target Goals: Exercise Program Goal: Individual exercise prescription set using results from initial 6 min walk test and THRR while considering  patient's activity barriers and safety.   Exercise Prescription Goal: Initial exercise prescription builds to 30-45 minutes a day of aerobic activity,  2-3 days per week.  Home exercise guidelines will be given to patient during program as part of exercise prescription that the participant will acknowledge.  Activity Barriers & Risk Stratification: Activity Barriers & Cardiac Risk Stratification - 07/25/18 0906      Activity Barriers & Cardiac Risk Stratification   Activity Barriers  None    Cardiac Risk Stratification  High       6 Minute Walk: 6 Minute Walk    Row Name 07/25/18 0905         6 Minute Walk   Phase  Initial     Distance  2000 feet     Walk Time  6 minutes     # of Rest Breaks  0     MPH  3.79     METS  3.92     RPE  11     Perceived Dyspnea   0     VO2 Peak  13.73     Symptoms   No     Resting HR  51 bpm     Resting BP  112/64     Resting Oxygen Saturation   95 %     Exercise Oxygen Saturation  during 6 min walk  96 %     Max Ex. HR  85 bpm     Max Ex. BP  120/70     2 Minute Post BP  112/62        Oxygen Initial Assessment:   Oxygen Re-Evaluation:   Oxygen Discharge (Final Oxygen Re-Evaluation):   Initial Exercise Prescription: Initial Exercise Prescription - 07/25/18 0900      Date of Initial Exercise RX and Referring Provider   Date  07/25/18    Referring Provider  Larae Grooms MD     Expected Discharge Date  11/01/18      Treadmill   MPH  3    Grade  2    Minutes  10    METs  4.12      Bike   Level  1.1    Minutes  10    METs  3.18      Rower   Level  3    Watts  25    Minutes  10    METs  4.4      Prescription Details   Frequency (times per week)  3x    Duration  Progress to 30 minutes of continuous aerobic without signs/symptoms of physical distress      Intensity   THRR 40-80% of Max Heartrate  61-122    Ratings of Perceived Exertion  11-13    Perceived Dyspnea  0-4      Progression   Progression  Continue progressive overload as per policy without signs/symptoms or physical distress.      Resistance Training   Training Prescription  Yes    Weight  5lbs    Reps  10-15       Perform Capillary Blood Glucose checks as needed.  Exercise Prescription Changes:   Exercise Comments:   Exercise Goals and Review: Exercise Goals    Row Name 07/25/18 0906             Exercise Goals   Increase Physical Activity  Yes       Intervention  Provide advice, education, support and counseling about physical activity/exercise needs.;Develop an individualized exercise prescription for aerobic and resistive training based on initial evaluation findings, risk stratification, comorbidities and participant's personal goals.  Expected Outcomes  Short Term: Attend rehab on a regular basis to increase amount of physical  activity.;Long Term: Add in home exercise to make exercise part of routine and to increase amount of physical activity.;Long Term: Exercising regularly at least 3-5 days a week.       Increase Strength and Stamina  Yes       Intervention  Provide advice, education, support and counseling about physical activity/exercise needs.;Develop an individualized exercise prescription for aerobic and resistive training based on initial evaluation findings, risk stratification, comorbidities and participant's personal goals.       Expected Outcomes  Short Term: Increase workloads from initial exercise prescription for resistance, speed, and METs.;Short Term: Perform resistance training exercises routinely during rehab and add in resistance training at home;Long Term: Improve cardiorespiratory fitness, muscular endurance and strength as measured by increased METs and functional capacity (6MWT)       Able to understand and use rate of perceived exertion (RPE) scale  Yes       Intervention  Provide education and explanation on how to use RPE scale       Expected Outcomes  Short Term: Able to use RPE daily in rehab to express subjective intensity level;Long Term:  Able to use RPE to guide intensity level when exercising independently       Knowledge and understanding of Target Heart Rate Range (THRR)  Yes       Intervention  Provide education and explanation of THRR including how the numbers were predicted and where they are located for reference       Expected Outcomes  Short Term: Able to state/look up THRR;Short Term: Able to use daily as guideline for intensity in rehab;Long Term: Able to use THRR to govern intensity when exercising independently       Able to check pulse independently  Yes       Intervention  Provide education and demonstration on how to check pulse in carotid and radial arteries.;Review the importance of being able to check your own pulse for safety during independent exercise       Expected  Outcomes  Short Term: Able to explain why pulse checking is important during independent exercise;Long Term: Able to check pulse independently and accurately       Understanding of Exercise Prescription  Yes       Intervention  Provide education, explanation, and written materials on patient's individual exercise prescription       Expected Outcomes  Short Term: Able to explain program exercise prescription;Long Term: Able to explain home exercise prescription to exercise independently          Exercise Goals Re-Evaluation :   Discharge Exercise Prescription (Final Exercise Prescription Changes):   Nutrition:  Target Goals: Understanding of nutrition guidelines, daily intake of sodium 1500mg , cholesterol 200mg , calories 30% from fat and 7% or less from saturated fats, daily to have 5 or more servings of fruits and vegetables.  Biometrics: Pre Biometrics - 07/25/18 0902      Pre Biometrics   Height  5' 9.75" (1.772 m)    Weight  93.2 kg    Waist Circumference  39 inches    Hip Circumference  41 inches    Waist to Hip Ratio  0.95 %    BMI (Calculated)  29.68    Triceps Skinfold  29 mm    % Body Fat  30.3 %    Grip Strength  29 kg    Flexibility  9 in  Single Leg Stand  27.74 seconds        Nutrition Therapy Plan and Nutrition Goals:   Nutrition Assessments:   Nutrition Goals Re-Evaluation:   Nutrition Goals Re-Evaluation:   Nutrition Goals Discharge (Final Nutrition Goals Re-Evaluation):   Psychosocial: Target Goals: Acknowledge presence or absence of significant depression and/or stress, maximize coping skills, provide positive support system. Participant is able to verbalize types and ability to use techniques and skills needed for reducing stress and depression.  Initial Review & Psychosocial Screening: Initial Psych Review & Screening - 07/25/18 0939      Initial Review   Current issues with  None Identified      Family Dynamics   Good Support System?   Yes   Ed lists his spouse, children, and friends as sources of support.      Barriers   Psychosocial barriers to participate in program  There are no identifiable barriers or psychosocial needs.      Screening Interventions   Interventions  Encouraged to exercise       Quality of Life Scores: Quality of Life - 07/25/18 0928      Quality of Life   Select  Quality of Life      Quality of Life Scores   Health/Function Pre  27.9 %    Socioeconomic Pre  29.38 %    Psych/Spiritual Pre  29.14 %    Family Pre  30 %    GLOBAL Pre  28.79 %      Scores of 19 and below usually indicate a poorer quality of life in these areas.  A difference of  2-3 points is a clinically meaningful difference.  A difference of 2-3 points in the total score of the Quality of Life Index has been associated with significant improvement in overall quality of life, self-image, physical symptoms, and general health in studies assessing change in quality of life.  PHQ-9: Recent Review Flowsheet Data    There is no flowsheet data to display.     Interpretation of Total Score  Total Score Depression Severity:  1-4 = Minimal depression, 5-9 = Mild depression, 10-14 = Moderate depression, 15-19 = Moderately severe depression, 20-27 = Severe depression   Psychosocial Evaluation and Intervention:   Psychosocial Re-Evaluation:   Psychosocial Discharge (Final Psychosocial Re-Evaluation):   Vocational Rehabilitation: Provide vocational rehab assistance to qualifying candidates.   Vocational Rehab Evaluation & Intervention: Vocational Rehab - 07/25/18 0939      Initial Vocational Rehab Evaluation & Intervention   Assessment shows need for Vocational Rehabilitation  No       Education: Education Goals: Education classes will be provided on a weekly basis, covering required topics. Participant will state understanding/return demonstration of topics presented.  Learning Barriers/Preferences: Learning  Barriers/Preferences - 07/25/18 0902      Learning Barriers/Preferences   Learning Barriers  None    Learning Preferences  Verbal Instruction;Skilled Demonstration;Individual Instruction;Written Material       Education Topics: Count Your Pulse:  -Group instruction provided by verbal instruction, demonstration, patient participation and written materials to support subject.  Instructors address importance of being able to find your pulse and how to count your pulse when at home without a heart monitor.  Patients get hands on experience counting their pulse with staff help and individually.   Heart Attack, Angina, and Risk Factor Modification:  -Group instruction provided by verbal instruction, video, and written materials to support subject.  Instructors address signs and symptoms of angina and heart  attacks.    Also discuss risk factors for heart disease and how to make changes to improve heart health risk factors.   Functional Fitness:  -Group instruction provided by verbal instruction, demonstration, patient participation, and written materials to support subject.  Instructors address safety measures for doing things around the house.  Discuss how to get up and down off the floor, how to pick things up properly, how to safely get out of a chair without assistance, and balance training.   Meditation and Mindfulness:  -Group instruction provided by verbal instruction, patient participation, and written materials to support subject.  Instructor addresses importance of mindfulness and meditation practice to help reduce stress and improve awareness.  Instructor also leads participants through a meditation exercise.    Stretching for Flexibility and Mobility:  -Group instruction provided by verbal instruction, patient participation, and written materials to support subject.  Instructors lead participants through series of stretches that are designed to increase flexibility thus improving  mobility.  These stretches are additional exercise for major muscle groups that are typically performed during regular warm up and cool down.   Hands Only CPR:  -Group verbal, video, and participation provides a basic overview of AHA guidelines for community CPR. Role-play of emergencies allow participants the opportunity to practice calling for help and chest compression technique with discussion of AED use.   Hypertension: -Group verbal and written instruction that provides a basic overview of hypertension including the most recent diagnostic guidelines, risk factor reduction with self-care instructions and medication management.    Nutrition I class: Heart Healthy Eating:  -Group instruction provided by PowerPoint slides, verbal discussion, and written materials to support subject matter. The instructor gives an explanation and review of the Therapeutic Lifestyle Changes diet recommendations, which includes a discussion on lipid goals, dietary fat, sodium, fiber, plant stanol/sterol esters, sugar, and the components of a well-balanced, healthy diet.   Nutrition II class: Lifestyle Skills:  -Group instruction provided by PowerPoint slides, verbal discussion, and written materials to support subject matter. The instructor gives an explanation and review of label reading, grocery shopping for heart health, heart healthy recipe modifications, and ways to make healthier choices when eating out.   Diabetes Question & Answer:  -Group instruction provided by PowerPoint slides, verbal discussion, and written materials to support subject matter. The instructor gives an explanation and review of diabetes co-morbidities, pre- and post-prandial blood glucose goals, pre-exercise blood glucose goals, signs, symptoms, and treatment of hypoglycemia and hyperglycemia, and foot care basics.   Diabetes Blitz:  -Group instruction provided by PowerPoint slides, verbal discussion, and written materials to  support subject matter. The instructor gives an explanation and review of the physiology behind type 1 and type 2 diabetes, diabetes medications and rational behind using different medications, pre- and post-prandial blood glucose recommendations and Hemoglobin A1c goals, diabetes diet, and exercise including blood glucose guidelines for exercising safely.    Portion Distortion:  -Group instruction provided by PowerPoint slides, verbal discussion, written materials, and food models to support subject matter. The instructor gives an explanation of serving size versus portion size, changes in portions sizes over the last 20 years, and what consists of a serving from each food group.   Stress Management:  -Group instruction provided by verbal instruction, video, and written materials to support subject matter.  Instructors review role of stress in heart disease and how to cope with stress positively.     Exercising on Your Own:  -Group instruction provided by verbal instruction, power  point, and written materials to support subject.  Instructors discuss benefits of exercise, components of exercise, frequency and intensity of exercise, and end points for exercise.  Also discuss use of nitroglycerin and activating EMS.  Review options of places to exercise outside of rehab.  Review guidelines for sex with heart disease.   Cardiac Drugs I:  -Group instruction provided by verbal instruction and written materials to support subject.  Instructor reviews cardiac drug classes: antiplatelets, anticoagulants, beta blockers, and statins.  Instructor discusses reasons, side effects, and lifestyle considerations for each drug class.   Cardiac Drugs II:  -Group instruction provided by verbal instruction and written materials to support subject.  Instructor reviews cardiac drug classes: angiotensin converting enzyme inhibitors (ACE-I), angiotensin II receptor blockers (ARBs), nitrates, and calcium channel  blockers.  Instructor discusses reasons, side effects, and lifestyle considerations for each drug class.   Anatomy and Physiology of the Circulatory System:  Group verbal and written instruction and models provide basic cardiac anatomy and physiology, with the coronary electrical and arterial systems. Review of: AMI, Angina, Valve disease, Heart Failure, Peripheral Artery Disease, Cardiac Arrhythmia, Pacemakers, and the ICD.   Other Education:  -Group or individual verbal, written, or video instructions that support the educational goals of the cardiac rehab program.   Holiday Eating Survival Tips:  -Group instruction provided by PowerPoint slides, verbal discussion, and written materials to support subject matter. The instructor gives patients tips, tricks, and techniques to help them not only survive but enjoy the holidays despite the onslaught of food that accompanies the holidays.   Knowledge Questionnaire Score: Knowledge Questionnaire Score - 07/25/18 0925      Knowledge Questionnaire Score   Pre Score  21/24       Core Components/Risk Factors/Patient Goals at Admission: Personal Goals and Risk Factors at Admission - 07/25/18 0901      Core Components/Risk Factors/Patient Goals on Admission    Weight Management  Yes;Weight Loss    Intervention  Weight Management: Develop a combined nutrition and exercise program designed to reach desired caloric intake, while maintaining appropriate intake of nutrient and fiber, sodium and fats, and appropriate energy expenditure required for the weight goal.;Weight Management: Provide education and appropriate resources to help participant work on and attain dietary goals.;Weight Management/Obesity: Establish reasonable short term and long term weight goals.;Obesity: Provide education and appropriate resources to help participant work on and attain dietary goals.    Admit Weight  205 lb 7.5 oz (93.2 kg)    Goal Weight: Short Term  200 lb (90.7  kg)    Goal Weight: Long Term  195 lb (88.5 kg)    Expected Outcomes  Short Term: Continue to assess and modify interventions until short term weight is achieved;Long Term: Adherence to nutrition and physical activity/exercise program aimed toward attainment of established weight goal;Weight Loss: Understanding of general recommendations for a balanced deficit meal plan, which promotes 1-2 lb weight loss per week and includes a negative energy balance of [phone] kcal/d;Understanding recommendations for meals to include 15-35% energy as protein, 25-35% energy from fat, 35-60% energy from carbohydrates, less than 200mg  of dietary cholesterol, 20-35 gm of total fiber daily;Understanding of distribution of calorie intake throughout the day with the consumption of 4-5 meals/snacks    Heart Failure  Yes    Intervention  Provide a combined exercise and nutrition program that is supplemented with education, support and counseling about heart failure. Directed toward relieving symptoms such as shortness of breath, decreased exercise tolerance, and extremity  edema.    Expected Outcomes  Improve functional capacity of life;Short term: Attendance in program 2-3 days a week with increased exercise capacity. Reported lower sodium intake. Reported increased fruit and vegetable intake. Reports medication compliance.;Short term: Daily weights obtained and reported for increase. Utilizing diuretic protocols set by physician.;Long term: Adoption of self-care skills and reduction of barriers for early signs and symptoms recognition and intervention leading to self-care maintenance.    Hypertension  Yes    Intervention  Provide education on lifestyle modifcations including regular physical activity/exercise, weight management, moderate sodium restriction and increased consumption of fresh fruit, vegetables, and low fat dairy, alcohol moderation, and smoking cessation.;Monitor prescription use compliance.    Expected Outcomes   Short Term: Continued assessment and intervention until BP is < 140/53mm HG in hypertensive participants. < 130/3mm HG in hypertensive participants with diabetes, heart failure or chronic kidney disease.;Long Term: Maintenance of blood pressure at goal levels.    Lipids  Yes    Intervention  Provide education and support for participant on nutrition & aerobic/resistive exercise along with prescribed medications to achieve LDL 70mg , HDL >40mg .    Expected Outcomes  Short Term: Participant states understanding of desired cholesterol values and is compliant with medications prescribed. Participant is following exercise prescription and nutrition guidelines.;Long Term: Cholesterol controlled with medications as prescribed, with individualized exercise RX and with personalized nutrition plan. Value goals: LDL < 70mg , HDL > 40 mg.    Stress  Yes    Intervention  Offer individual and/or small group education and counseling on adjustment to heart disease, stress management and health-related lifestyle change. Teach and support self-help strategies.;Refer participants experiencing significant psychosocial distress to appropriate mental health specialists for further evaluation and treatment. When possible, include family members and significant others in education/counseling sessions.    Expected Outcomes  Short Term: Participant demonstrates changes in health-related behavior, relaxation and other stress management skills, ability to obtain effective social support, and compliance with psychotropic medications if prescribed.;Long Term: Emotional wellbeing is indicated by absence of clinically significant psychosocial distress or social isolation.       Core Components/Risk Factors/Patient Goals Review:    Core Components/Risk Factors/Patient Goals at Discharge (Final Review):    ITP Comments: ITP Comments    Row Name 07/25/18 0826           ITP Comments  Dr. Fransico Him, Medical Director           Comments: Patient attended orientation from [phone] to 0930 to review rules and guidelines for program. Completed 6 minute walk test, Intitial ITP, and exercise prescription.  VSS. Telemetry-SR.  Asymptomatic.

## 2018-07-25 NOTE — Progress Notes (Signed)
Alex Herrera 68 y.o. male DOB: 1950/09/12 MRN: [id number]      Nutrition Note  1. ST elevation myocardial infarction involving left anterior descending (LAD) coronary artery (HCC)   2. Status post coronary artery stent placement    Past Medical History:  Diagnosis Date  . Acute CHF (congestive heart failure) ([facility])    a. presumed diastolic LVEDP 39 during cath 04/2018.  Marland Kitchen Allergic rhinitis   . Arthritis   . Coronary artery disease    a. PCI to RCA 2015. b. STEMI 04/2018 s/p DESx2 to LAD, EF 55-60%.  . Hearing loss   . History of kidney stones   . Hyperlipidemia   . Hypertension   . Skin cancer, basal cell 2000   Meds reviewed.    Current Outpatient Medications (Cardiovascular):  .  atorvastatin (LIPITOR) 80 MG tablet, Take 80 mg by mouth daily. .  Azilsartan Medoxomil (EDARBI) 80 MG TABS, Take 80 mg by mouth daily.  .  carvedilol (COREG) 6.25 MG tablet, Take 1 tablet (6.25 mg total) by mouth 2 (two) times daily.  Current Outpatient Medications (Respiratory):  .  fluticasone (FLONASE) 50 MCG/ACT nasal spray, Place 2 sprays into both nostrils daily as needed (seasonal allergies).   Current Outpatient Medications (Analgesics):  .  acetaminophen (TYLENOL) 325 MG tablet, Take 2 tablets (650 mg total) by mouth every 4 (four) hours as needed for headache or mild pain. Marland Kitchen  aspirin 81 MG chewable tablet, Chew 1 tablet (81 mg total) by mouth daily.  Current Outpatient Medications (Hematological):  .  prasugrel (EFFIENT) 10 MG TABS tablet, Take 60 mg on first dose tomorrow 06/29/18. Beginning on 06/30/18 start taking 1 tablet daily  Current Outpatient Medications (Other):  Marland Kitchen  Coenzyme Q10 (COQ10 PO), Take 1 capsule by mouth daily. Marland Kitchen  gabapentin (NEURONTIN) 300 MG capsule, Take 300 mg by mouth at bedtime. Marland Kitchen  GLUCOSAMINE-CHONDROITIN PO, Take 2 tablets by mouth daily. .  mometasone (ELOCON) 0.1 % cream, Apply 1 application topically daily as needed (wound care). .  Saw Palmetto, Serenoa  repens, (SAW PALMETTO PO), Take 2 capsules by mouth daily. .  valACYclovir (VALTREX) 1000 MG tablet, Take 1,000 mg by mouth daily.    HT: Ht Readings from Last 1 Encounters:  07/25/18 5' 9.75" (1.772 m)    WT: Wt Readings from Last 5 Encounters:  07/25/18 205 lb 7.5 oz (93.2 kg)  06/27/18 203 lb 6.4 oz (92.3 kg)  05/13/18 204 lb 12.8 oz (92.9 kg)  04/28/18 199 lb 8.3 oz (90.5 kg)  08/23/15 199 lb (90.3 kg)     Body mass index is 29.69 kg/m.   Current tobacco use? No  Labs:  Lipid Panel     Component Value Date/Time   CHOL 146 04/26/2018 1313   TRIG 92 04/26/2018 1313   HDL 57 04/26/2018 1313   CHOLHDL 2.6 04/26/2018 1313   VLDL 18 04/26/2018 1313   LDLCALC 71 04/26/2018 1313    No results found for: HGBA1C CBG (last 3)  No results for input(s): GLUCAP in the last 72 hours.  Nutrition Note Spoke with pt. Nutrition plan and goals reviewed with pt. Pt is following Step 1 of the Therapeutic Lifestyle Changes diet. Pt wants to lose wt. Pt has not been trying to lose wt. Heart healthy eating tips and weight loss tips reviewed (label reading, how to build a healthy plate, portion sizes, eating frequently across the day). Pt shared he has a busy schedule and this is a barrier to healthy  eating. Dicussed quick and easy grab and go meals/snacks he can make for his busy life style. PT shared he also tends to over eat when he has long periods between meals. Recommended pt add a snack in that has protein + carb between meals to help manage hunger. Per discussion, pt does not use canned/convenience foods often. Pt does not add salt to food. Pt does eat out frequently. Pt expressed understanding of the information reviewed. Pt aware of nutrition education classes offered and would not like to attend nutrition classes.  Nutrition Diagnosis ? Food-and nutrition-related knowledge deficit related to lack of exposure to information as related to diagnosis of: ? CVD  ? Overweight  related to  excessive energy intake as evidenced by a Body mass index is 29.69 kg/m.  Nutrition Intervention ? Pt's individual nutrition plan and goals reviewed with pt. ? Pt given handouts for: ? Nutrition I class ? Nutrition II class   Nutrition Goal(s):  ? Pt to identify and limit food sources of saturated fat, trans fat, refined carbohydrates and sodium ? Add a Snack between meals to help manage hunger ? Pt to identify food quantities necessary to achieve weight loss of 6-24 lbs. at graduation from cardiac rehab.    Plan:  ? Pt to attend nutrition classes ? Nutrition I ? Nutrition II ? Portion Distortion  ? Will provide client-centered nutrition education as part of interdisciplinary care ? Monitor and evaluate progress toward nutrition goal with team.   Laurina Bustle, MS, RD, LDN 07/25/2018 11:11 AM

## 2018-07-29 ENCOUNTER — Encounter (HOSPITAL_COMMUNITY)
Admission: RE | Admit: 2018-07-29 | Discharge: 2018-07-29 | Disposition: A | Discharge status: Home / Self Care | Payer: Medicare Other | Source: Ambulatory Visit | Attending: Interventional Cardiology | Admitting: Interventional Cardiology

## 2018-07-29 ENCOUNTER — Ambulatory Visit (HOSPITAL_COMMUNITY): Payer: Medicare Other

## 2018-07-29 DIAGNOSIS — I2102 ST elevation (STEMI) myocardial infarction involving left anterior descending coronary artery: Secondary | ICD-10-CM | POA: Diagnosis not present

## 2018-07-29 DIAGNOSIS — Z955 Presence of coronary angioplasty implant and graft: Secondary | ICD-10-CM

## 2018-07-29 NOTE — Progress Notes (Signed)
Daily Session Note  Patient Details  Name: Alex Herrera MRN: [id number] Date of Birth: 1951-02-01 Referring Provider:     [place] from 07/25/2018 in [place]  Referring Provider  Larae Grooms MD       Encounter Date: 07/29/2018  Check In: Session Check In - 07/29/18 1000      Check-In   Supervising physician immediately available to respond to emergencies  [facility] immediately available    Physician(s)  Dr. Tana Coast    Location  [facility]    Staff Present  Dorma Russell, MS,ACSM CEP, Exercise Physiologist;Maria Whitaker, RN, Deland Pretty, MS, ACSM CEP, Exercise Physiologist    Medication changes reported      No    Fall or balance concerns reported     No    Tobacco Cessation  No Change    Warm-up and Cool-down  Performed as group-led instruction    Resistance Training Performed  Yes    VAD Patient?  No    PAD/SET Patient?  No      Pain Assessment   Currently in Pain?  No/denies    Pain Score  0-No pain    Multiple Pain Sites  No       Capillary Blood Glucose: No results found for this or any previous visit (from the past 24 hour(s)).    Social History   Tobacco Use  Smoking Status Never Smoker  Smokeless Tobacco Never Used    Goals Met:  Exercise tolerated well  Goals Unmet:  Not Applicable  Comments: Pt started cardiac rehab today.  Pt tolerated light exercise without difficulty. VSS, telemetry-Sin, asymptomatic.  Medication list reconciled. Pt denies barriers to medicaiton compliance.  PSYCHOSOCIAL ASSESSMENT:  PHQ-0. Pt exhibits positive coping skills, hopeful outlook with supportive family. No psychosocial needs identified at this time, no psychosocial interventions necessary.    Pt enjoys playing the guitar and singing.   Pt oriented to exercise equipment and routine.    Understanding verbalized.Barnet Pall, RN,BSN 07/29/2018 11:16 AM  Dr. Fransico Him is  Medical Director for Cardiac Rehab at [facility].

## 2018-07-30 NOTE — Progress Notes (Signed)
Cardiac Individual Treatment Plan  Patient Details  Name: Alex Herrera MRN: [id number] Date of Birth: 06/14/1951 Referring Provider:     [place] from 07/25/2018 in [place]  Referring Provider  Larae Grooms MD       Initial Encounter Date:    CARDIAC REHAB [place] from 07/25/2018 in [place]  Date  07/25/18      Visit Diagnosis: ST elevation myocardial infarction involving left anterior descending (LAD) coronary artery [facility])  Status post coronary artery stent placement  Patient's Home Medications on Admission:  Current Outpatient Medications:  .  acetaminophen (TYLENOL) 325 MG tablet, Take 2 tablets (650 mg total) by mouth every 4 (four) hours as needed for headache or mild pain., Disp: , Rfl:  .  aspirin 81 MG chewable tablet, Chew 1 tablet (81 mg total) by mouth daily., Disp: , Rfl:  .  atorvastatin (LIPITOR) 80 MG tablet, Take 80 mg by mouth daily., Disp: , Rfl:  .  Azilsartan Medoxomil (EDARBI) 80 MG TABS, Take 80 mg by mouth daily. , Disp: , Rfl:  .  carvedilol (COREG) 6.25 MG tablet, Take 1 tablet (6.25 mg total) by mouth 2 (two) times daily., Disp: 180 tablet, Rfl: 3 .  Coenzyme Q10 (COQ10 PO), Take 1 capsule by mouth daily., Disp: , Rfl:  .  fluticasone (FLONASE) 50 MCG/ACT nasal spray, Place 2 sprays into both nostrils daily as needed (seasonal allergies). , Disp: , Rfl:  .  gabapentin (NEURONTIN) 300 MG capsule, Take 300 mg by mouth at bedtime., Disp: , Rfl:  .  GLUCOSAMINE-CHONDROITIN PO, Take 2 tablets by mouth daily., Disp: , Rfl:  .  mometasone (ELOCON) 0.1 % cream, Apply 1 application topically daily as needed (wound care)., Disp: , Rfl:  .  prasugrel (EFFIENT) 10 MG TABS tablet, Take 60 mg on first dose tomorrow 06/29/18. Beginning on 06/30/18 start taking 1 tablet daily, Disp: 100 tablet, Rfl: 3 .  Saw Palmetto, Serenoa repens, (SAW PALMETTO PO), Take 2  capsules by mouth daily., Disp: , Rfl:  .  valACYclovir (VALTREX) 1000 MG tablet, Take 1,000 mg by mouth daily. , Disp: , Rfl:   Past Medical History: Past Medical History:  Diagnosis Date  . Acute CHF (congestive heart failure) ([place])    a. presumed diastolic LVEDP 39 during cath 04/2018.  [place] Allergic rhinitis   . Arthritis   . Coronary artery disease    a. PCI to RCA 2015. b. STEMI 04/2018 s/p DESx2 to LAD, EF 55-60%.  . Hearing loss   . History of kidney stones   . Hyperlipidemia   . Hypertension   . Skin cancer, basal cell 2000    Tobacco Use: Social History   Tobacco Use  Smoking Status Never Smoker  Smokeless Tobacco Never Used    Labs: Recent Review Scientist, physiological    Labs for ITP Cardiac and Pulmonary Rehab Latest Ref Rng & Units 04/26/2018   Cholestrol 0 - 200 mg/dL 146   LDLCALC 0 - 99 mg/dL 71   HDL >40 mg/dL 57   Trlycerides <150 mg/dL 92      Capillary Blood Glucose: No results found for: GLUCAP   Exercise Target Goals: Exercise Program Goal: Individual exercise prescription set using results from initial 6 min walk test and THRR while considering  patient's activity barriers and safety.   Exercise Prescription Goal: Initial exercise prescription builds to 30-45 minutes a day of aerobic activity,  2-3 days per week.  Home exercise guidelines will be given to patient during program as part of exercise prescription that the participant will acknowledge.  Activity Barriers & Risk Stratification: Activity Barriers & Cardiac Risk Stratification - 07/25/18 0906      Activity Barriers & Cardiac Risk Stratification   Activity Barriers  None    Cardiac Risk Stratification  High       6 Minute Walk: 6 Minute Walk    Row Name 07/25/18 0905         6 Minute Walk   Phase  Initial     Distance  2000 feet     Walk Time  6 minutes     # of Rest Breaks  0     MPH  3.79     METS  3.92     RPE  11     Perceived Dyspnea   0     VO2 Peak  13.73     Symptoms   No     Resting HR  51 bpm     Resting BP  112/64     Resting Oxygen Saturation   95 %     Exercise Oxygen Saturation  during 6 min walk  96 %     Max Ex. HR  85 bpm     Max Ex. BP  120/70     2 Minute Post BP  112/62        Oxygen Initial Assessment:   Oxygen Re-Evaluation:   Oxygen Discharge (Final Oxygen Re-Evaluation):   Initial Exercise Prescription: Initial Exercise Prescription - 07/25/18 0900      Date of Initial Exercise RX and Referring Provider   Date  07/25/18    Referring Provider  Larae Grooms MD     Expected Discharge Date  11/01/18      Treadmill   MPH  3    Grade  2    Minutes  10    METs  4.12      Bike   Level  1.1    Minutes  10    METs  3.18      Rower   Level  3    Watts  25    Minutes  10    METs  4.4      Prescription Details   Frequency (times per week)  3x    Duration  Progress to 30 minutes of continuous aerobic without signs/symptoms of physical distress      Intensity   THRR 40-80% of Max Heartrate  61-122    Ratings of Perceived Exertion  11-13    Perceived Dyspnea  0-4      Progression   Progression  Continue progressive overload as per policy without signs/symptoms or physical distress.      Resistance Training   Training Prescription  Yes    Weight  5lbs    Reps  10-15       Perform Capillary Blood Glucose checks as needed.  Exercise Prescription Changes:  Exercise Prescription Changes    Row Name 07/29/18 0955             Response to Exercise   Blood Pressure (Admit)  125/74       Blood Pressure (Exercise)  152/60       Blood Pressure (Exit)  118/68       Heart Rate (Admit)  70 bpm       Heart Rate (Exercise)  99 bpm  Heart Rate (Exit)  61 bpm       Rating of Perceived Exertion (Exercise)  13       Symptoms  none       Duration  Progress to 30 minutes of  aerobic without signs/symptoms of physical distress       Intensity  THRR unchanged         Progression   Progression  Continue to  progress workloads to maintain intensity without signs/symptoms of physical distress.       Average METs  4         Resistance Training   Training Prescription  Yes       Weight  5lbs       Reps  10-15       Time  10 Minutes         Interval Training   Interval Training  No         Treadmill   MPH  3       Grade  2       Minutes  10       METs  4.12         Bike   Level  1.1       Minutes  10       METs  3.22         Rower   Level  3       Watts  30       Minutes  10       METs  4.62          Exercise Comments:  Exercise Comments    Row Name 07/29/18 1043           Exercise Comments  Patient tolerated 1st session of exercise well without c/o.          Exercise Goals and Review:  Exercise Goals    Row Name 07/25/18 0906             Exercise Goals   Increase Physical Activity  Yes       Intervention  Provide advice, education, support and counseling about physical activity/exercise needs.;Develop an individualized exercise prescription for aerobic and resistive training based on initial evaluation findings, risk stratification, comorbidities and participant's personal goals.       Expected Outcomes  Short Term: Attend rehab on a regular basis to increase amount of physical activity.;Long Term: Add in home exercise to make exercise part of routine and to increase amount of physical activity.;Long Term: Exercising regularly at least 3-5 days a week.       Increase Strength and Stamina  Yes       Intervention  Provide advice, education, support and counseling about physical activity/exercise needs.;Develop an individualized exercise prescription for aerobic and resistive training based on initial evaluation findings, risk stratification, comorbidities and participant's personal goals.       Expected Outcomes  Short Term: Increase workloads from initial exercise prescription for resistance, speed, and METs.;Short Term: Perform resistance training exercises  routinely during rehab and add in resistance training at home;Long Term: Improve cardiorespiratory fitness, muscular endurance and strength as measured by increased METs and functional capacity (6MWT)       Able to understand and use rate of perceived exertion (RPE) scale  Yes       Intervention  Provide education and explanation on how to use RPE scale       Expected Outcomes  Short Term: Able to  use RPE daily in rehab to express subjective intensity level;Long Term:  Able to use RPE to guide intensity level when exercising independently       Knowledge and understanding of Target Heart Rate Range (THRR)  Yes       Intervention  Provide education and explanation of THRR including how the numbers were predicted and where they are located for reference       Expected Outcomes  Short Term: Able to state/look up THRR;Short Term: Able to use daily as guideline for intensity in rehab;Long Term: Able to use THRR to govern intensity when exercising independently       Able to check pulse independently  Yes       Intervention  Provide education and demonstration on how to check pulse in carotid and radial arteries.;Review the importance of being able to check your own pulse for safety during independent exercise       Expected Outcomes  Short Term: Able to explain why pulse checking is important during independent exercise;Long Term: Able to check pulse independently and accurately       Understanding of Exercise Prescription  Yes       Intervention  Provide education, explanation, and written materials on patient's individual exercise prescription       Expected Outcomes  Short Term: Able to explain program exercise prescription;Long Term: Able to explain home exercise prescription to exercise independently          Exercise Goals Re-Evaluation : Exercise Goals Re-Evaluation    Row Name 07/29/18 1000             Exercise Goal Re-Evaluation   Exercise Goals Review  Increase Physical Activity;Able to  understand and use rate of perceived exertion (RPE) scale;Knowledge and understanding of Target Heart Rate Range (THRR)       Comments  Reviewed RPE scale and THRR. Patient able to understand and use RPE scale appropriately.       Expected Outcomes  Increase workloads as tolerated to help achieve personal health and fitness goals.          Discharge Exercise Prescription (Final Exercise Prescription Changes): Exercise Prescription Changes - 07/29/18 0955      Response to Exercise   Blood Pressure (Admit)  125/74    Blood Pressure (Exercise)  152/60    Blood Pressure (Exit)  118/68    Heart Rate (Admit)  70 bpm    Heart Rate (Exercise)  99 bpm    Heart Rate (Exit)  61 bpm    Rating of Perceived Exertion (Exercise)  13    Symptoms  none    Duration  Progress to 30 minutes of  aerobic without signs/symptoms of physical distress    Intensity  THRR unchanged      Progression   Progression  Continue to progress workloads to maintain intensity without signs/symptoms of physical distress.    Average METs  4      Resistance Training   Training Prescription  Yes    Weight  5lbs    Reps  10-15    Time  10 Minutes      Interval Training   Interval Training  No      Treadmill   MPH  3    Grade  2    Minutes  10    METs  4.12      Bike   Level  1.1    Minutes  10    METs  3.22  Rower   Level  3    Watts  30    Minutes  10    METs  4.62       Nutrition:  Target Goals: Understanding of nutrition guidelines, daily intake of sodium 1500mg , cholesterol 200mg , calories 30% from fat and 7% or less from saturated fats, daily to have 5 or more servings of fruits and vegetables.  Biometrics: Pre Biometrics - 07/25/18 0902      Pre Biometrics   Height  5' 9.75" (1.772 m)    Weight  205 lb 7.5 oz (93.2 kg)    Waist Circumference  39 inches    Hip Circumference  41 inches    Waist to Hip Ratio  0.95 %    BMI (Calculated)  29.68    Triceps Skinfold  29 mm    % Body Fat   30.3 %    Grip Strength  29 kg    Flexibility  9 in    Single Leg Stand  27.74 seconds        Nutrition Therapy Plan and Nutrition Goals: Nutrition Therapy & Goals - 07/25/18 1111      Nutrition Therapy   Diet  heart healthy      Personal Nutrition Goals   Nutrition Goal  Pt to identify and limit food sources of saturated fat, trans fat, refined carbohydrates and sodium    Personal Goal #2  Add a Snack between meals to help manage hunger    Personal Goal #3  Pt to identify food quantities necessary to achieve weight loss of 6-24 lbs. at graduation from cardiac rehab      [place], educate and counsel regarding individualized specific dietary modifications aiming towards targeted core components such as weight, hypertension, lipid management, diabetes, heart failure and other comorbidities.    Expected Outcomes  Short Term Goal: Understand basic principles of dietary content, such as calories, fat, sodium, cholesterol and nutrients.;Long Term Goal: Adherence to prescribed nutrition plan.       Nutrition Assessments: Nutrition Assessments - 07/25/18 1119      MEDFICTS Scores   Pre Score  40       Nutrition Goals Re-Evaluation: Nutrition Goals Re-Evaluation    Row Name 07/25/18 1111             Goals   Current Weight  205 lb 7.5 oz (93.2 kg)          Nutrition Goals Re-Evaluation: Nutrition Goals Re-Evaluation    [place] Name 07/25/18 1111             Goals   Current Weight  205 lb 7.5 oz (93.2 kg)          Nutrition Goals Discharge (Final Nutrition Goals Re-Evaluation): Nutrition Goals Re-Evaluation - 07/25/18 1111      Goals   Current Weight  205 lb 7.5 oz (93.2 kg)       Psychosocial: Target Goals: Acknowledge presence or absence of significant depression and/or stress, maximize coping skills, provide positive support system. Participant is able to verbalize types and ability to use techniques and skills needed for  reducing stress and depression.  Initial Review & Psychosocial Screening: Initial Psych Review & Screening - 07/25/18 0939      Initial Review   Current issues with  None Identified      Family Dynamics   Good Support System?  Yes   Ed lists his spouse, children, and friends as sources of support.  Barriers   Psychosocial barriers to participate in program  There are no identifiable barriers or psychosocial needs.      Screening Interventions   Interventions  Encouraged to exercise       Quality of Life Scores: Quality of Life - 07/25/18 0928      Quality of Life   Select  Quality of Life      Quality of Life Scores   Health/Function Pre  27.9 %    Socioeconomic Pre  29.38 %    Psych/Spiritual Pre  29.14 %    Family Pre  30 %    GLOBAL Pre  28.79 %      Scores of 19 and below usually indicate a poorer quality of life in these areas.  A difference of  2-3 points is a clinically meaningful difference.  A difference of 2-3 points in the total score of the Quality of Life Index has been associated with significant improvement in overall quality of life, self-image, physical symptoms, and general health in studies assessing change in quality of life.  PHQ-9: Recent Review Flowsheet Data    There is no flowsheet data to display.     Interpretation of Total Score  Total Score Depression Severity:  1-4 = Minimal depression, 5-9 = Mild depression, 10-14 = Moderate depression, 15-19 = Moderately severe depression, 20-27 = Severe depression   Psychosocial Evaluation and Intervention:   Psychosocial Re-Evaluation:   Psychosocial Discharge (Final Psychosocial Re-Evaluation):   Vocational Rehabilitation: Provide vocational rehab assistance to qualifying candidates.   Vocational Rehab Evaluation & Intervention: Vocational Rehab - 07/25/18 0939      Initial Vocational Rehab Evaluation & Intervention   Assessment shows need for Vocational Rehabilitation  No        Education: Education Goals: Education classes will be provided on a weekly basis, covering required topics. Participant will state understanding/return demonstration of topics presented.  Learning Barriers/Preferences: Learning Barriers/Preferences - 07/25/18 0902      Learning Barriers/Preferences   Learning Barriers  None    Learning Preferences  Verbal Instruction;Skilled Demonstration;Individual Instruction;Written Material       Education Topics: Count Your Pulse:  -Group instruction provided by verbal instruction, demonstration, patient participation and written materials to support subject.  Instructors address importance of being able to find your pulse and how to count your pulse when at home without a heart monitor.  Patients get hands on experience counting their pulse with staff help and individually.   Heart Attack, Angina, and Risk Factor Modification:  -Group instruction provided by verbal instruction, video, and written materials to support subject.  Instructors address signs and symptoms of angina and heart attacks.    Also discuss risk factors for heart disease and how to make changes to improve heart health risk factors.   Functional Fitness:  -Group instruction provided by verbal instruction, demonstration, patient participation, and written materials to support subject.  Instructors address safety measures for doing things around the house.  Discuss how to get up and down off the floor, how to pick things up properly, how to safely get out of a chair without assistance, and balance training.   Meditation and Mindfulness:  -Group instruction provided by verbal instruction, patient participation, and written materials to support subject.  Instructor addresses importance of mindfulness and meditation practice to help reduce stress and improve awareness.  Instructor also leads participants through a meditation exercise.    Stretching for Flexibility and Mobility:   -Group instruction provided by verbal instruction,  patient participation, and written materials to support subject.  Instructors lead participants through series of stretches that are designed to increase flexibility thus improving mobility.  These stretches are additional exercise for major muscle groups that are typically performed during regular warm up and cool down.   Hands Only CPR:  -Group verbal, video, and participation provides a basic overview of AHA guidelines for community CPR. Role-play of emergencies allow participants the opportunity to practice calling for help and chest compression technique with discussion of AED use.   Hypertension: -Group verbal and written instruction that provides a basic overview of hypertension including the most recent diagnostic guidelines, risk factor reduction with self-care instructions and medication management.    Nutrition I class: Heart Healthy Eating:  -Group instruction provided by PowerPoint slides, verbal discussion, and written materials to support subject matter. The instructor gives an explanation and review of the Therapeutic Lifestyle Changes diet recommendations, which includes a discussion on lipid goals, dietary fat, sodium, fiber, plant stanol/sterol esters, sugar, and the components of a well-balanced, healthy diet.   Nutrition II class: Lifestyle Skills:  -Group instruction provided by PowerPoint slides, verbal discussion, and written materials to support subject matter. The instructor gives an explanation and review of label reading, grocery shopping for heart health, heart healthy recipe modifications, and ways to make healthier choices when eating out.   Diabetes Question & Answer:  -Group instruction provided by PowerPoint slides, verbal discussion, and written materials to support subject matter. The instructor gives an explanation and review of diabetes co-morbidities, pre- and post-prandial blood glucose goals,  pre-exercise blood glucose goals, signs, symptoms, and treatment of hypoglycemia and hyperglycemia, and foot care basics.   Diabetes Blitz:  -Group instruction provided by PowerPoint slides, verbal discussion, and written materials to support subject matter. The instructor gives an explanation and review of the physiology behind type 1 and type 2 diabetes, diabetes medications and rational behind using different medications, pre- and post-prandial blood glucose recommendations and Hemoglobin A1c goals, diabetes diet, and exercise including blood glucose guidelines for exercising safely.    Portion Distortion:  -Group instruction provided by PowerPoint slides, verbal discussion, written materials, and food models to support subject matter. The instructor gives an explanation of serving size versus portion size, changes in portions sizes over the last 20 years, and what consists of a serving from each food group.   Stress Management:  -Group instruction provided by verbal instruction, video, and written materials to support subject matter.  Instructors review role of stress in heart disease and how to cope with stress positively.     Exercising on Your Own:  -Group instruction provided by verbal instruction, power point, and written materials to support subject.  Instructors discuss benefits of exercise, components of exercise, frequency and intensity of exercise, and end points for exercise.  Also discuss use of nitroglycerin and activating EMS.  Review options of places to exercise outside of rehab.  Review guidelines for sex with heart disease.   Cardiac Drugs I:  -Group instruction provided by verbal instruction and written materials to support subject.  Instructor reviews cardiac drug classes: antiplatelets, anticoagulants, beta blockers, and statins.  Instructor discusses reasons, side effects, and lifestyle considerations for each drug class.   Cardiac Drugs II:  -Group instruction  provided by verbal instruction and written materials to support subject.  Instructor reviews cardiac drug classes: angiotensin converting enzyme inhibitors (ACE-I), angiotensin II receptor blockers (ARBs), nitrates, and calcium channel blockers.  Instructor discusses reasons, side effects, and lifestyle  considerations for each drug class.   Anatomy and Physiology of the Circulatory System:  Group verbal and written instruction and models provide basic cardiac anatomy and physiology, with the coronary electrical and arterial systems. Review of: AMI, Angina, Valve disease, Heart Failure, Peripheral Artery Disease, Cardiac Arrhythmia, Pacemakers, and the ICD.   Other Education:  -Group or individual verbal, written, or video instructions that support the educational goals of the cardiac rehab program.   Holiday Eating Survival Tips:  -Group instruction provided by PowerPoint slides, verbal discussion, and written materials to support subject matter. The instructor gives patients tips, tricks, and techniques to help them not only survive but enjoy the holidays despite the onslaught of food that accompanies the holidays.   Knowledge Questionnaire Score: Knowledge Questionnaire Score - 07/25/18 0925      Knowledge Questionnaire Score   Pre Score  21/24       Core Components/Risk Factors/Patient Goals at Admission: Personal Goals and Risk Factors at Admission - 07/25/18 0901      Core Components/Risk Factors/Patient Goals on Admission    Weight Management  Yes;Weight Loss    Intervention  Weight Management: Develop a combined nutrition and exercise program designed to reach desired caloric intake, while maintaining appropriate intake of nutrient and fiber, sodium and fats, and appropriate energy expenditure required for the weight goal.;Weight Management: Provide education and appropriate resources to help participant work on and attain dietary goals.;Weight Management/Obesity: Establish  reasonable short term and long term weight goals.;Obesity: Provide education and appropriate resources to help participant work on and attain dietary goals.    Admit Weight  205 lb 7.5 oz (93.2 kg)    Goal Weight: Short Term  200 lb (90.7 kg)    Goal Weight: Long Term  195 lb (88.5 kg)    Expected Outcomes  Short Term: Continue to assess and modify interventions until short term weight is achieved;Long Term: Adherence to nutrition and physical activity/exercise program aimed toward attainment of established weight goal;Weight Loss: Understanding of general recommendations for a balanced deficit meal plan, which promotes 1-2 lb weight loss per week and includes a negative energy balance of [phone] kcal/d;Understanding recommendations for meals to include 15-35% energy as protein, 25-35% energy from fat, 35-60% energy from carbohydrates, less than 200mg  of dietary cholesterol, 20-35 gm of total fiber daily;Understanding of distribution of calorie intake throughout the day with the consumption of 4-5 meals/snacks    Heart Failure  Yes    Intervention  Provide a combined exercise and nutrition program that is supplemented with education, support and counseling about heart failure. Directed toward relieving symptoms such as shortness of breath, decreased exercise tolerance, and extremity edema.    Expected Outcomes  Improve functional capacity of life;Short term: Attendance in program 2-3 days a week with increased exercise capacity. Reported lower sodium intake. Reported increased fruit and vegetable intake. Reports medication compliance.;Short term: Daily weights obtained and reported for increase. Utilizing diuretic protocols set by physician.;Long term: Adoption of self-care skills and reduction of barriers for early signs and symptoms recognition and intervention leading to self-care maintenance.    Hypertension  Yes    Intervention  Provide education on lifestyle modifcations including regular physical  activity/exercise, weight management, moderate sodium restriction and increased consumption of fresh fruit, vegetables, and low fat dairy, alcohol moderation, and smoking cessation.;Monitor prescription use compliance.    Expected Outcomes  Short Term: Continued assessment and intervention until BP is < 140/56mm HG in hypertensive participants. < 130/13mm HG in hypertensive participants  with diabetes, heart failure or chronic kidney disease.;Long Term: Maintenance of blood pressure at goal levels.    Lipids  Yes    Intervention  Provide education and support for participant on nutrition & aerobic/resistive exercise along with prescribed medications to achieve LDL 70mg , HDL >40mg .    Expected Outcomes  Short Term: Participant states understanding of desired cholesterol values and is compliant with medications prescribed. Participant is following exercise prescription and nutrition guidelines.;Long Term: Cholesterol controlled with medications as prescribed, with individualized exercise RX and with personalized nutrition plan. Value goals: LDL < 70mg , HDL > 40 mg.    Stress  Yes    Intervention  Offer individual and/or small group education and counseling on adjustment to heart disease, stress management and health-related lifestyle change. Teach and support self-help strategies.;Refer participants experiencing significant psychosocial distress to appropriate mental health specialists for further evaluation and treatment. When possible, include family members and significant others in education/counseling sessions.    Expected Outcomes  Short Term: Participant demonstrates changes in health-related behavior, relaxation and other stress management skills, ability to obtain effective social support, and compliance with psychotropic medications if prescribed.;Long Term: Emotional wellbeing is indicated by absence of clinically significant psychosocial distress or social isolation.       Core Components/Risk  Factors/Patient Goals Review:  Goals and Risk Factor Review    Row Name 07/30/18 1504             Core Components/Risk Factors/Patient Goals Review   Personal Goals Review  Weight Management/Obesity;Heart Failure;Lipids;Hypertension       Review  Ed started exercise at cardiac rehab on 07/29/18       Expected Outcomes  Ed will continue to participate in phase 2 cardiac rehab for exercise , nutrition and lifestyle modifications          Core Components/Risk Factors/Patient Goals at Discharge (Final Review):  Goals and Risk Factor Review - 07/30/18 1504      Core Components/Risk Factors/Patient Goals Review   Personal Goals Review  Weight Management/Obesity;Heart Failure;Lipids;Hypertension    Review  Ed started exercise at cardiac rehab on 07/29/18    Expected Outcomes  Ed will continue to participate in phase 2 cardiac rehab for exercise , nutrition and lifestyle modifications       ITP Comments: ITP Comments    Row Name 07/25/18 0826 07/30/18 1503         ITP Comments  Dr. Fransico Him, Medical Director  30 Day ITP Review. Ed completed his first day of exercise without difficulty         Comments: See ITP comments.Barnet Pall, RN,BSN 08/01/2018 11:18 AM

## 2018-07-31 ENCOUNTER — Encounter (HOSPITAL_COMMUNITY)
Admission: RE | Admit: 2018-07-31 | Discharge: 2018-07-31 | Disposition: A | Discharge status: Home / Self Care | Payer: Medicare Other | Source: Ambulatory Visit | Attending: Interventional Cardiology | Admitting: Interventional Cardiology

## 2018-07-31 ENCOUNTER — Ambulatory Visit (HOSPITAL_COMMUNITY): Payer: Medicare Other

## 2018-07-31 DIAGNOSIS — Z955 Presence of coronary angioplasty implant and graft: Secondary | ICD-10-CM

## 2018-07-31 DIAGNOSIS — I2102 ST elevation (STEMI) myocardial infarction involving left anterior descending coronary artery: Secondary | ICD-10-CM

## 2018-07-31 NOTE — Progress Notes (Addendum)
Alex Herrera 68 y.o. male Nutrition Note Spoke with pt. Nutrition plan and goals reviewed with pt. Pt is following heart healthy diet. Pt wants to lose wt. Pt has added in snack, has started carrying snacks in car and bringing lunch with him to work. Reviewed heart healthy eating tips and weight loss tips discussed at orientation (label reading, how to build a healthy plate, portion sizes, eating frequently across the day). Pt shared while he has a busy schedule he has started to work around this by being prepared. Distributed recipes for quick grab and go meals and to help with meal prep ahead of time. PT shared he also tends to over eat when he has long periods between meals. Recommended pt add a snack in that has protein + carb between meals to help manage hunger. Per discussion, pt does not use canned/convenience foods often. Pt does not add salt to food. Pt does eat out frequently. Pt expressed understanding of the information reviewed. Pt aware of nutrition education classes offered and would not like to attend nutrition classes.  No results found for: HGBA1C  Wt Readings from Last 3 Encounters:  07/25/18 205 lb 7.5 oz (93.2 kg)  06/27/18 203 lb 6.4 oz (92.3 kg)  05/13/18 204 lb 12.8 oz (92.9 kg)    Nutrition Diagnosis  Food-and nutrition-related knowledge deficit related to lack of exposure to information as related to diagnosis of: ? CVD   Overweight  related to excessive energy intake as evidenced by a Body mass index is 29.69 kg/m.  Nutrition Intervention ? Pt's individual nutrition plan reviewed with pt. ? Benefits of adopting Heart Healthy diet discussed when [employer] reviewed  Goal(s) ? Pt to identify and limit food sources of saturated fat, trans fat, refined carbohydrates and sodium  ? Add a Snack between meals to help manage hunger  ? Pt to identify food quantities necessary to achieve weight loss of 6-24 lbs. at graduation from cardiac rehab.    Plan:   Pt to  attend nutrition classes ? Nutrition I ? Nutrition II ? Portion Distortion   Will provide client-centered nutrition education as part of interdisciplinary care  Monitor and evaluate progress toward nutrition goal with team.    Laurina Bustle, MS, RD, LDN 07/31/2018 10:41 AM

## 2018-08-02 ENCOUNTER — Encounter (HOSPITAL_COMMUNITY)
Admission: RE | Admit: 2018-08-02 | Discharge: 2018-08-02 | Disposition: A | Discharge status: Home / Self Care | Payer: Medicare Other | Source: Ambulatory Visit | Attending: Interventional Cardiology | Admitting: Interventional Cardiology

## 2018-08-02 ENCOUNTER — Ambulatory Visit (HOSPITAL_COMMUNITY): Payer: Medicare Other

## 2018-08-02 DIAGNOSIS — I2102 ST elevation (STEMI) myocardial infarction involving left anterior descending coronary artery: Secondary | ICD-10-CM | POA: Diagnosis not present

## 2018-08-02 DIAGNOSIS — Z955 Presence of coronary angioplasty implant and graft: Secondary | ICD-10-CM

## 2018-08-05 ENCOUNTER — Ambulatory Visit (HOSPITAL_COMMUNITY): Payer: Medicare Other

## 2018-08-05 ENCOUNTER — Encounter (HOSPITAL_COMMUNITY)
Admission: RE | Admit: 2018-08-05 | Discharge: 2018-08-05 | Disposition: A | Discharge status: Home / Self Care | Payer: Medicare Other | Source: Ambulatory Visit | Attending: Interventional Cardiology | Admitting: Interventional Cardiology

## 2018-08-05 DIAGNOSIS — Z955 Presence of coronary angioplasty implant and graft: Secondary | ICD-10-CM

## 2018-08-05 DIAGNOSIS — I2102 ST elevation (STEMI) myocardial infarction involving left anterior descending coronary artery: Secondary | ICD-10-CM | POA: Diagnosis not present

## 2018-08-07 ENCOUNTER — Ambulatory Visit (HOSPITAL_COMMUNITY): Payer: Medicare Other

## 2018-08-07 ENCOUNTER — Encounter (HOSPITAL_COMMUNITY)
Admission: RE | Admit: 2018-08-07 | Discharge: 2018-08-07 | Disposition: A | Discharge status: Home / Self Care | Payer: Medicare Other | Source: Ambulatory Visit | Attending: Interventional Cardiology | Admitting: Interventional Cardiology

## 2018-08-07 DIAGNOSIS — I2102 ST elevation (STEMI) myocardial infarction involving left anterior descending coronary artery: Secondary | ICD-10-CM

## 2018-08-07 DIAGNOSIS — Z955 Presence of coronary angioplasty implant and graft: Secondary | ICD-10-CM

## 2018-08-09 ENCOUNTER — Ambulatory Visit (HOSPITAL_COMMUNITY): Payer: Medicare Other

## 2018-08-09 ENCOUNTER — Encounter (HOSPITAL_COMMUNITY)
Admission: RE | Admit: 2018-08-09 | Discharge: 2018-08-09 | Disposition: A | Discharge status: Home / Self Care | Payer: Medicare Other | Source: Ambulatory Visit | Attending: Interventional Cardiology | Admitting: Interventional Cardiology

## 2018-08-09 DIAGNOSIS — I2102 ST elevation (STEMI) myocardial infarction involving left anterior descending coronary artery: Secondary | ICD-10-CM

## 2018-08-09 DIAGNOSIS — Z955 Presence of coronary angioplasty implant and graft: Secondary | ICD-10-CM

## 2018-08-12 ENCOUNTER — Encounter (HOSPITAL_COMMUNITY)
Admission: RE | Admit: 2018-08-12 | Discharge: 2018-08-12 | Disposition: A | Discharge status: Home / Self Care | Payer: Medicare Other | Source: Ambulatory Visit | Attending: Interventional Cardiology | Admitting: Interventional Cardiology

## 2018-08-12 ENCOUNTER — Ambulatory Visit (HOSPITAL_COMMUNITY): Payer: Medicare Other

## 2018-08-12 DIAGNOSIS — I2102 ST elevation (STEMI) myocardial infarction involving left anterior descending coronary artery: Secondary | ICD-10-CM | POA: Diagnosis not present

## 2018-08-12 DIAGNOSIS — Z955 Presence of coronary angioplasty implant and graft: Secondary | ICD-10-CM

## 2018-08-14 ENCOUNTER — Ambulatory Visit (HOSPITAL_COMMUNITY): Payer: Medicare Other

## 2018-08-14 ENCOUNTER — Encounter (HOSPITAL_COMMUNITY)
Admission: RE | Admit: 2018-08-14 | Discharge: 2018-08-14 | Disposition: A | Discharge status: Home / Self Care | Payer: Medicare Other | Source: Ambulatory Visit | Attending: Interventional Cardiology | Admitting: Interventional Cardiology

## 2018-08-14 DIAGNOSIS — I2102 ST elevation (STEMI) myocardial infarction involving left anterior descending coronary artery: Secondary | ICD-10-CM | POA: Diagnosis not present

## 2018-08-14 DIAGNOSIS — Z955 Presence of coronary angioplasty implant and graft: Secondary | ICD-10-CM

## 2018-08-14 NOTE — Progress Notes (Signed)
Reviewed home exercise guidelines with patient including endpoints, temperature precautions, target heart rate and rate of perceived exertion. Pt is walking at home and exercising at a local gym as his mode of home exercise. Pt voices understanding of instructions given. Sol Passer, MS, ACSM CEP

## 2018-08-16 ENCOUNTER — Encounter (HOSPITAL_COMMUNITY)
Admission: RE | Admit: 2018-08-16 | Discharge: 2018-08-16 | Disposition: A | Discharge status: Home / Self Care | Payer: Medicare Other | Source: Ambulatory Visit | Attending: Interventional Cardiology | Admitting: Interventional Cardiology

## 2018-08-16 ENCOUNTER — Ambulatory Visit (HOSPITAL_COMMUNITY): Payer: Medicare Other

## 2018-08-16 DIAGNOSIS — Z955 Presence of coronary angioplasty implant and graft: Secondary | ICD-10-CM

## 2018-08-16 DIAGNOSIS — I2102 ST elevation (STEMI) myocardial infarction involving left anterior descending coronary artery: Secondary | ICD-10-CM

## 2018-08-19 ENCOUNTER — Encounter (HOSPITAL_COMMUNITY)
Admission: RE | Admit: 2018-08-19 | Discharge: 2018-08-19 | Disposition: A | Discharge status: Home / Self Care | Payer: Medicare Other | Source: Ambulatory Visit | Attending: Interventional Cardiology | Admitting: Interventional Cardiology

## 2018-08-19 ENCOUNTER — Ambulatory Visit (HOSPITAL_COMMUNITY): Payer: Medicare Other

## 2018-08-19 DIAGNOSIS — Z955 Presence of coronary angioplasty implant and graft: Secondary | ICD-10-CM

## 2018-08-19 DIAGNOSIS — I2102 ST elevation (STEMI) myocardial infarction involving left anterior descending coronary artery: Secondary | ICD-10-CM | POA: Diagnosis not present

## 2018-08-21 ENCOUNTER — Ambulatory Visit (HOSPITAL_COMMUNITY): Payer: Medicare Other

## 2018-08-21 ENCOUNTER — Encounter (HOSPITAL_COMMUNITY)
Admission: RE | Admit: 2018-08-21 | Discharge: 2018-08-21 | Disposition: A | Discharge status: Home / Self Care | Payer: Medicare Other | Source: Ambulatory Visit | Attending: Interventional Cardiology | Admitting: Interventional Cardiology

## 2018-08-21 DIAGNOSIS — Z955 Presence of coronary angioplasty implant and graft: Secondary | ICD-10-CM

## 2018-08-21 DIAGNOSIS — I2102 ST elevation (STEMI) myocardial infarction involving left anterior descending coronary artery: Secondary | ICD-10-CM

## 2018-08-23 ENCOUNTER — Ambulatory Visit (HOSPITAL_COMMUNITY): Payer: Medicare Other

## 2018-08-23 ENCOUNTER — Encounter (HOSPITAL_COMMUNITY)
Admission: RE | Admit: 2018-08-23 | Discharge: 2018-08-23 | Disposition: A | Discharge status: Home / Self Care | Payer: Medicare Other | Source: Ambulatory Visit | Attending: Interventional Cardiology | Admitting: Interventional Cardiology

## 2018-08-23 DIAGNOSIS — Z955 Presence of coronary angioplasty implant and graft: Secondary | ICD-10-CM

## 2018-08-23 DIAGNOSIS — I2102 ST elevation (STEMI) myocardial infarction involving left anterior descending coronary artery: Secondary | ICD-10-CM

## 2018-08-26 ENCOUNTER — Ambulatory Visit (HOSPITAL_COMMUNITY): Payer: Medicare Other

## 2018-08-26 ENCOUNTER — Encounter (HOSPITAL_COMMUNITY): Payer: Medicare Other

## 2018-08-28 ENCOUNTER — Encounter (HOSPITAL_COMMUNITY)
Admission: RE | Admit: 2018-08-28 | Discharge: 2018-08-28 | Disposition: A | Discharge status: Home / Self Care | Payer: Medicare Other | Source: Ambulatory Visit | Attending: Interventional Cardiology | Admitting: Interventional Cardiology

## 2018-08-28 ENCOUNTER — Ambulatory Visit (HOSPITAL_COMMUNITY): Payer: Medicare Other

## 2018-08-28 DIAGNOSIS — Z955 Presence of coronary angioplasty implant and graft: Secondary | ICD-10-CM | POA: Diagnosis present

## 2018-08-28 DIAGNOSIS — I2102 ST elevation (STEMI) myocardial infarction involving left anterior descending coronary artery: Secondary | ICD-10-CM | POA: Insufficient documentation

## 2018-08-29 NOTE — Progress Notes (Signed)
Cardiac Individual Treatment Plan  Patient Details  Name: Alex Herrera MRN: [id number] Date of Birth: 06/14/1951 Referring Provider:     [place] from 07/25/2018 in [place]  Referring Provider  Larae Grooms MD       Initial Encounter Date:    CARDIAC REHAB [place] from 07/25/2018 in [place]  Date  07/25/18      Visit Diagnosis: ST elevation myocardial infarction involving left anterior descending (LAD) coronary artery [facility])  Status post coronary artery stent placement  Patient's Home Medications on Admission:  Current Outpatient Medications:  .  acetaminophen (TYLENOL) 325 MG tablet, Take 2 tablets (650 mg total) by mouth every 4 (four) hours as needed for headache or mild pain., Disp: , Rfl:  .  aspirin 81 MG chewable tablet, Chew 1 tablet (81 mg total) by mouth daily., Disp: , Rfl:  .  atorvastatin (LIPITOR) 80 MG tablet, Take 80 mg by mouth daily., Disp: , Rfl:  .  Azilsartan Medoxomil (EDARBI) 80 MG TABS, Take 80 mg by mouth daily. , Disp: , Rfl:  .  carvedilol (COREG) 6.25 MG tablet, Take 1 tablet (6.25 mg total) by mouth 2 (two) times daily., Disp: 180 tablet, Rfl: 3 .  Coenzyme Q10 (COQ10 PO), Take 1 capsule by mouth daily., Disp: , Rfl:  .  fluticasone (FLONASE) 50 MCG/ACT nasal spray, Place 2 sprays into both nostrils daily as needed (seasonal allergies). , Disp: , Rfl:  .  gabapentin (NEURONTIN) 300 MG capsule, Take 300 mg by mouth at bedtime., Disp: , Rfl:  .  GLUCOSAMINE-CHONDROITIN PO, Take 2 tablets by mouth daily., Disp: , Rfl:  .  mometasone (ELOCON) 0.1 % cream, Apply 1 application topically daily as needed (wound care)., Disp: , Rfl:  .  prasugrel (EFFIENT) 10 MG TABS tablet, Take 60 mg on first dose tomorrow 06/29/18. Beginning on 06/30/18 start taking 1 tablet daily, Disp: 100 tablet, Rfl: 3 .  Saw Palmetto, Serenoa repens, (SAW PALMETTO PO), Take 2  capsules by mouth daily., Disp: , Rfl:  .  valACYclovir (VALTREX) 1000 MG tablet, Take 1,000 mg by mouth daily. , Disp: , Rfl:   Past Medical History: Past Medical History:  Diagnosis Date  . Acute CHF (congestive heart failure) ([place])    a. presumed diastolic LVEDP 39 during cath 04/2018.  [place] Allergic rhinitis   . Arthritis   . Coronary artery disease    a. PCI to RCA 2015. b. STEMI 04/2018 s/p DESx2 to LAD, EF 55-60%.  . Hearing loss   . History of kidney stones   . Hyperlipidemia   . Hypertension   . Skin cancer, basal cell 2000    Tobacco Use: Social History   Tobacco Use  Smoking Status Never Smoker  Smokeless Tobacco Never Used    Labs: Recent Review Scientist, physiological    Labs for ITP Cardiac and Pulmonary Rehab Latest Ref Rng & Units 04/26/2018   Cholestrol 0 - 200 mg/dL 146   LDLCALC 0 - 99 mg/dL 71   HDL >40 mg/dL 57   Trlycerides <150 mg/dL 92      Capillary Blood Glucose: No results found for: GLUCAP   Exercise Target Goals: Exercise Program Goal: Individual exercise prescription set using results from initial 6 min walk test and THRR while considering  patient's activity barriers and safety.   Exercise Prescription Goal: Initial exercise prescription builds to 30-45 minutes a day of aerobic activity,  2-3 days per week.  Home exercise guidelines will be given to patient during program as part of exercise prescription that the participant will acknowledge.  Activity Barriers & Risk Stratification: Activity Barriers & Cardiac Risk Stratification - 07/25/18 0906      Activity Barriers & Cardiac Risk Stratification   Activity Barriers  None    Cardiac Risk Stratification  High       6 Minute Walk: 6 Minute Walk    Row Name 07/25/18 0905         6 Minute Walk   Phase  Initial     Distance  2000 feet     Walk Time  6 minutes     # of Rest Breaks  0     MPH  3.79     METS  3.92     RPE  11     Perceived Dyspnea   0     VO2 Peak  13.73     Symptoms   No     Resting HR  51 bpm     Resting BP  112/64     Resting Oxygen Saturation   95 %     Exercise Oxygen Saturation  during 6 min walk  96 %     Max Ex. HR  85 bpm     Max Ex. BP  120/70     2 Minute Post BP  112/62        Oxygen Initial Assessment:   Oxygen Re-Evaluation:   Oxygen Discharge (Final Oxygen Re-Evaluation):   Initial Exercise Prescription: Initial Exercise Prescription - 07/25/18 0900      Date of Initial Exercise RX and Referring Provider   Date  07/25/18    Referring Provider  Larae Grooms MD     Expected Discharge Date  11/01/18      Treadmill   MPH  3    Grade  2    Minutes  10    METs  4.12      Bike   Level  1.1    Minutes  10    METs  3.18      Rower   Level  3    Watts  25    Minutes  10    METs  4.4      Prescription Details   Frequency (times per week)  3x    Duration  Progress to 30 minutes of continuous aerobic without signs/symptoms of physical distress      Intensity   THRR 40-80% of Max Heartrate  61-122    Ratings of Perceived Exertion  11-13    Perceived Dyspnea  0-4      Progression   Progression  Continue progressive overload as per policy without signs/symptoms or physical distress.      Resistance Training   Training Prescription  Yes    Weight  5lbs    Reps  10-15       Perform Capillary Blood Glucose checks as needed.  Exercise Prescription Changes: Exercise Prescription Changes    Row Name 07/29/18 0955 08/05/18 0952 08/19/18 0952         Response to Exercise   Blood Pressure (Admit)  125/74  128/80  120/74     Blood Pressure (Exercise)  152/60  164/82  146/82     Blood Pressure (Exit)  118/68  104/60  102/62     Heart Rate (Admit)  70 bpm  59 bpm  87 bpm  Heart Rate (Exercise)  99 bpm  104 bpm  103 bpm     Heart Rate (Exit)  61 bpm  59 bpm  67 bpm     Rating of Perceived Exertion (Exercise)  13  13  12      Symptoms  none  none  none     Duration  Progress to 30 minutes of  aerobic  without signs/symptoms of physical distress  Progress to 30 minutes of  aerobic without signs/symptoms of physical distress  Progress to 30 minutes of  aerobic without signs/symptoms of physical distress     Intensity  THRR unchanged  THRR unchanged  THRR unchanged       Progression   Progression  Continue to progress workloads to maintain intensity without signs/symptoms of physical distress.  Continue to progress workloads to maintain intensity without signs/symptoms of physical distress.  Continue to progress workloads to maintain intensity without signs/symptoms of physical distress.     Average METs  4  4.5  4.3       Resistance Training   Training Prescription  Yes  Yes  Yes     Weight  5lbs  5lbs  5lbs     Reps  10-15  10-15  10-15     Time  10 Minutes  10 Minutes  10 Minutes       Interval Training   Interval Training  No  No  No       Treadmill   MPH  3  3  3.3     Grade  2  3  3.5     Minutes  10  10  10      METs  4.12  4.54  5.12       Bike   Level  1.1  1.5  1.5     Minutes  10  10  10      METs  3.22  4.06  4.03       NuStep   Level  -  -  4     SPM  -  -  85     Minutes  -  -  10     METs  -  -  3.7       Rower   Level  3  3  -     Watts  30  35  -     Minutes  10  10  -     METs  4.62  4.81  -       Home Exercise Plan   Plans to continue exercise at  [facility] (comment) Patient exercising at gym and walking at home.  [employer] (comment) Patient exercising at gym and walking at home.     Frequency  -  Add 4 additional days to program exercise sessions.  Add 4 additional days to program exercise sessions.        Exercise Comments: Exercise Comments    Row Name 07/29/18 1043 08/05/18 1006 08/07/18 1005 08/19/18 1011     Exercise Comments  Patient tolerated 1st session of exercise well without c/o.  Reviewed METs and goals with patient.  Reviewed home exercise guidelines, METs, and goals with patient.  Reviewed METs with patient.        Exercise Goals and Review: Exercise Goals    Row Name 07/25/18 0906             Exercise Goals   Increase Physical Activity  Yes       Intervention  Provide advice, education, support and counseling about physical activity/exercise needs.;Develop an individualized exercise prescription for aerobic and resistive training based on initial evaluation findings, risk stratification, comorbidities and participant's personal goals.       Expected Outcomes  Short Term: Attend rehab on a regular basis to increase amount of physical activity.;Long Term: Add in home exercise to make exercise part of routine and to increase amount of physical activity.;Long Term: Exercising regularly at least 3-5 days a week.       Increase Strength and Stamina  Yes       Intervention  Provide advice, education, support and counseling about physical activity/exercise needs.;Develop an individualized exercise prescription for aerobic and resistive training based on initial evaluation findings, risk stratification, comorbidities and participant's personal goals.       Expected Outcomes  Short Term: Increase workloads from initial exercise prescription for resistance, speed, and METs.;Short Term: Perform resistance training exercises routinely during rehab and add in resistance training at home;Long Term: Improve cardiorespiratory fitness, muscular endurance and strength as measured by increased METs and functional capacity (6MWT)       Able to understand and use rate of perceived exertion (RPE) scale  Yes       Intervention  Provide education and explanation on how to use RPE scale       Expected Outcomes  Short Term: Able to use RPE daily in rehab to express subjective intensity level;Long Term:  Able to use RPE to guide intensity level when exercising independently       Knowledge and understanding of Target Heart Rate Range (THRR)  Yes       Intervention  Provide education and explanation of THRR including how the  numbers were predicted and where they are located for reference       Expected Outcomes  Short Term: Able to state/look up THRR;Short Term: Able to use daily as guideline for intensity in rehab;Long Term: Able to use THRR to govern intensity when exercising independently       Able to check pulse independently  Yes       Intervention  Provide education and demonstration on how to check pulse in carotid and radial arteries.;Review the importance of being able to check your own pulse for safety during independent exercise       Expected Outcomes  Short Term: Able to explain why pulse checking is important during independent exercise;Long Term: Able to check pulse independently and accurately       Understanding of Exercise Prescription  Yes       Intervention  Provide education, explanation, and written materials on patient's individual exercise prescription       Expected Outcomes  Short Term: Able to explain program exercise prescription;Long Term: Able to explain home exercise prescription to exercise independently          Exercise Goals Re-Evaluation : Exercise Goals Re-Evaluation    Row Name 07/29/18 1000 08/05/18 1006 08/07/18 1005         Exercise Goal Re-Evaluation   Exercise Goals Review  Increase Physical Activity;Able to understand and use rate of perceived exertion (RPE) scale;Knowledge and understanding of Target Heart Rate Range (THRR)  Increase Physical Activity;Able to understand and use rate of perceived exertion (RPE) scale;Knowledge and understanding of Target Heart Rate Range (THRR)  Increase Physical Activity;Able to understand and use rate of perceived exertion (RPE) scale;Knowledge and understanding of Target Heart Rate Range (THRR);Understanding of Exercise  Prescription     Comments  Reviewed RPE scale and THRR. Patient able to understand and use RPE scale appropriately.  Patient is exercising 60 minutes, 2 days/week at the gym: stretching, elliptical, and weight training,  and walking 45-60 minutes, 2 days/week at home as his mode of home exercise.   Reviewed home exercise guidelines with patient including THRR, RPE scale, and endpoints for exercise. Pt is exercising at the gym and walking at home on the days he doesn't attend cardiac rehab.     Expected Outcomes  Increase workloads as tolerated to help achieve personal health and fitness goals.  Patient will conitnue daily exercise routine to help improve cardiorespiratory fitness.  Continue current exercise routine, increasing workloads at cardiac rehab as tolerated.        Discharge Exercise Prescription (Final Exercise Prescription Changes): Exercise Prescription Changes - 08/19/18 0952      Response to Exercise   Blood Pressure (Admit)  120/74    Blood Pressure (Exercise)  146/82    Blood Pressure (Exit)  102/62    Heart Rate (Admit)  87 bpm    Heart Rate (Exercise)  103 bpm    Heart Rate (Exit)  67 bpm    Rating of Perceived Exertion (Exercise)  12    Symptoms  none    Duration  Progress to 30 minutes of  aerobic without signs/symptoms of physical distress    Intensity  THRR unchanged      Progression   Progression  Continue to progress workloads to maintain intensity without signs/symptoms of physical distress.    Average METs  4.3      Resistance Training   Training Prescription  Yes    Weight  5lbs    Reps  10-15    Time  10 Minutes      Interval Training   Interval Training  No      Treadmill   MPH  3.3    Grade  3.5    Minutes  10    METs  5.12      Bike   Level  1.5    Minutes  10    METs  4.03      NuStep   Level  4    SPM  85    Minutes  10    METs  3.7      Rower   Level  --    Watts  --    Minutes  --    METs  --      Home Exercise Plan   Plans to continue exercise at  [employer] (comment)   Patient exercising at gym and walking at home.   Frequency  Add 4 additional days to program exercise sessions.       Nutrition:  Target Goals: Understanding  of nutrition guidelines, daily intake of sodium 1500mg , cholesterol 200mg , calories 30% from fat and 7% or less from saturated fats, daily to have 5 or more servings of fruits and vegetables.  Biometrics: Pre Biometrics - 07/25/18 0902      Pre Biometrics   Height  5' 9.75" (1.772 m)    Weight  93.2 kg    Waist Circumference  39 inches    Hip Circumference  41 inches    Waist to Hip Ratio  0.95 %    BMI (Calculated)  29.68    Triceps Skinfold  29 mm    % Body Fat  30.3 %    Grip Strength  29 kg    Flexibility  9 in    Single Leg Stand  27.74 seconds        Nutrition Therapy Plan and Nutrition Goals: Nutrition Therapy & Goals - 07/25/18 1111      Nutrition Therapy   Diet  heart healthy      Personal Nutrition Goals   Nutrition Goal  Pt to identify and limit food sources of saturated fat, trans fat, refined carbohydrates and sodium    Personal Goal #2  Add a Snack between meals to help manage hunger    Personal Goal #3  Pt to identify food quantities necessary to achieve weight loss of 6-24 lbs. at graduation from cardiac rehab      [place], educate and counsel regarding individualized specific dietary modifications aiming towards targeted core components such as weight, hypertension, lipid management, diabetes, heart failure and other comorbidities.    Expected Outcomes  Short Term Goal: Understand basic principles of dietary content, such as calories, fat, sodium, cholesterol and nutrients.;Long Term Goal: Adherence to prescribed nutrition plan.       Nutrition Assessments: Nutrition Assessments - 07/25/18 1119      MEDFICTS Scores   Pre Score  40       Nutrition Goals Re-Evaluation: Nutrition Goals Re-Evaluation    Row Name 07/25/18 1111             Goals   Current Weight  205 lb 7.5 oz (93.2 kg)          Nutrition Goals Re-Evaluation: Nutrition Goals Re-Evaluation    [place] Name 07/25/18 1111             Goals    Current Weight  205 lb 7.5 oz (93.2 kg)          Nutrition Goals Discharge (Final Nutrition Goals Re-Evaluation): Nutrition Goals Re-Evaluation - 07/25/18 1111      Goals   Current Weight  205 lb 7.5 oz (93.2 kg)       Psychosocial: Target Goals: Acknowledge presence or absence of significant depression and/or stress, maximize coping skills, provide positive support system. Participant is able to verbalize types and ability to use techniques and skills needed for reducing stress and depression.  Initial Review & Psychosocial Screening: Initial Psych Review & Screening - 07/25/18 0939      Initial Review   Current issues with  None Identified      Family Dynamics   Good Support System?  Yes   Ed lists his spouse, children, and friends as sources of support.      Barriers   Psychosocial barriers to participate in program  There are no identifiable barriers or psychosocial needs.      Screening Interventions   Interventions  Encouraged to exercise       Quality of Life Scores: Quality of Life - 07/25/18 0928      Quality of Life   Select  Quality of Life      Quality of Life Scores   Health/Function Pre  27.9 %    Socioeconomic Pre  29.38 %    Psych/Spiritual Pre  29.14 %    Family Pre  30 %    GLOBAL Pre  28.79 %      Scores of 19 and below usually indicate a poorer quality of life in these areas.  A difference of  2-3 points is a clinically meaningful difference.  A difference of 2-3 points in the total  score of the Quality of Life Index has been associated with significant improvement in overall quality of life, self-image, physical symptoms, and general health in studies assessing change in quality of life.  PHQ-9: Recent Review Flowsheet Data    There is no flowsheet data to display.     Interpretation of Total Score  Total Score Depression Severity:  1-4 = Minimal depression, 5-9 = Mild depression, 10-14 = Moderate depression, 15-19 = Moderately severe  depression, 20-27 = Severe depression   Psychosocial Evaluation and Intervention:   Psychosocial Re-Evaluation: Psychosocial Re-Evaluation    Row Name 08/29/18 [phone]             Psychosocial Re-Evaluation   Current issues with  None Identified       Interventions  Encouraged to attend Cardiac Rehabilitation for the exercise       Continue Psychosocial Services   No Follow up required          Psychosocial Discharge (Final Psychosocial Re-Evaluation): Psychosocial Re-Evaluation - 08/29/18 0829      Psychosocial Re-Evaluation   Current issues with  None Identified    Interventions  Encouraged to attend Cardiac Rehabilitation for the exercise    Continue Psychosocial Services   No Follow up required       Vocational Rehabilitation: Provide vocational rehab assistance to qualifying candidates.   Vocational Rehab Evaluation & Intervention: Vocational Rehab - 07/25/18 0939      Initial Vocational Rehab Evaluation & Intervention   Assessment shows need for Vocational Rehabilitation  No       Education: Education Goals: Education classes will be provided on a weekly basis, covering required topics. Participant will state understanding/return demonstration of topics presented.  Learning Barriers/Preferences: Learning Barriers/Preferences - 07/25/18 0902      Learning Barriers/Preferences   Learning Barriers  None    Learning Preferences  Verbal Instruction;Skilled Demonstration;Individual Instruction;Written Material       Education Topics: Count Your Pulse:  -Group instruction provided by verbal instruction, demonstration, patient participation and written materials to support subject.  Instructors address importance of being able to find your pulse and how to count your pulse when at home without a heart monitor.  Patients get hands on experience counting their pulse with staff help and individually.   CARDIAC REHAB PHASE II EXERCISE from 08/28/2018 in [place]  Date  08/16/18  Educator  RN  Instruction Review Code  2- Demonstrated Understanding      Heart Attack, Angina, and Risk Factor Modification:  -Group instruction provided by verbal instruction, video, and written materials to support subject.  Instructors address signs and symptoms of angina and heart attacks.    Also discuss risk factors for heart disease and how to make changes to improve heart health risk factors.   Functional Fitness:  -Group instruction provided by verbal instruction, demonstration, patient participation, and written materials to support subject.  Instructors address safety measures for doing things around the house.  Discuss how to get up and down off the floor, how to pick things up properly, how to safely get out of a chair without assistance, and balance training.   CARDIAC REHAB PHASE II EXERCISE from 08/28/2018 in [place]  Date  08/09/18  Educator  EP  Instruction Review Code  2- Demonstrated Understanding      Meditation and Mindfulness:  -Group instruction provided by verbal instruction, patient participation, and written materials to support subject.  Instructor addresses importance  of mindfulness and meditation practice to help reduce stress and improve awareness.  Instructor also leads participants through a meditation exercise.    CARDIAC REHAB PHASE II EXERCISE from 08/28/2018 in [place]  Date  08/07/18  Instruction Review Code  2- Demonstrated Understanding      Stretching for Flexibility and Mobility:  -Group instruction provided by verbal instruction, patient participation, and written materials to support subject.  Instructors lead participants through series of stretches that are designed to increase flexibility thus improving mobility.  These stretches are additional exercise for major muscle groups that are typically performed during regular warm up  and cool down.   Hands Only CPR:  -Group verbal, video, and participation provides a basic overview of AHA guidelines for community CPR. Role-play of emergencies allow participants the opportunity to practice calling for help and chest compression technique with discussion of AED use.   Hypertension: -Group verbal and written instruction that provides a basic overview of hypertension including the most recent diagnostic guidelines, risk factor reduction with self-care instructions and medication management.    Nutrition I class: Heart Healthy Eating:  -Group instruction provided by PowerPoint slides, verbal discussion, and written materials to support subject matter. The instructor gives an explanation and review of the Therapeutic Lifestyle Changes diet recommendations, which includes a discussion on lipid goals, dietary fat, sodium, fiber, plant stanol/sterol esters, sugar, and the components of a well-balanced, healthy diet.   Nutrition II class: Lifestyle Skills:  -Group instruction provided by PowerPoint slides, verbal discussion, and written materials to support subject matter. The instructor gives an explanation and review of label reading, grocery shopping for heart health, heart healthy recipe modifications, and ways to make healthier choices when eating out.   Diabetes Question & Answer:  -Group instruction provided by PowerPoint slides, verbal discussion, and written materials to support subject matter. The instructor gives an explanation and review of diabetes co-morbidities, pre- and post-prandial blood glucose goals, pre-exercise blood glucose goals, signs, symptoms, and treatment of hypoglycemia and hyperglycemia, and foot care basics.   Diabetes Blitz:  -Group instruction provided by PowerPoint slides, verbal discussion, and written materials to support subject matter. The instructor gives an explanation and review of the physiology behind type 1 and type 2 diabetes, diabetes  medications and rational behind using different medications, pre- and post-prandial blood glucose recommendations and Hemoglobin A1c goals, diabetes diet, and exercise including blood glucose guidelines for exercising safely.    Portion Distortion:  -Group instruction provided by PowerPoint slides, verbal discussion, written materials, and food models to support subject matter. The instructor gives an explanation of serving size versus portion size, changes in portions sizes over the last 20 years, and what consists of a serving from each food group.   Stress Management:  -Group instruction provided by verbal instruction, video, and written materials to support subject matter.  Instructors review role of stress in heart disease and how to cope with stress positively.     CARDIAC REHAB PHASE II EXERCISE from 08/28/2018 in [place]  Date  08/21/18  Educator  RN  Instruction Review Code  2- Demonstrated Understanding      Exercising on Your Own:  -Group instruction provided by verbal instruction, power point, and written materials to support subject.  Instructors discuss benefits of exercise, components of exercise, frequency and intensity of exercise, and end points for exercise.  Also discuss use of nitroglycerin and activating EMS.  Review options of places to  exercise outside of rehab.  Review guidelines for sex with heart disease.   Cardiac Drugs I:  -Group instruction provided by verbal instruction and written materials to support subject.  Instructor reviews cardiac drug classes: antiplatelets, anticoagulants, beta blockers, and statins.  Instructor discusses reasons, side effects, and lifestyle considerations for each drug class.   CARDIAC REHAB PHASE II EXERCISE from 08/28/2018 in [place]  Date  07/31/18  Instruction Review Code  2- Demonstrated Understanding      Cardiac Drugs II:  -Group instruction provided by  verbal instruction and written materials to support subject.  Instructor reviews cardiac drug classes: angiotensin converting enzyme inhibitors (ACE-I), angiotensin II receptor blockers (ARBs), nitrates, and calcium channel blockers.  Instructor discusses reasons, side effects, and lifestyle considerations for each drug class.   Anatomy and Physiology of the Circulatory System:  Group verbal and written instruction and models provide basic cardiac anatomy and physiology, with the coronary electrical and arterial systems. Review of: AMI, Angina, Valve disease, Heart Failure, Peripheral Artery Disease, Cardiac Arrhythmia, Pacemakers, and the ICD.   CARDIAC REHAB PHASE II EXERCISE from 08/28/2018 in [place]  Date  08/28/18  Educator  RN  Instruction Review Code  2- Demonstrated Understanding      Other Education:  -Group or individual verbal, written, or video instructions that support the educational goals of the cardiac rehab program.   Holiday Eating Survival Tips:  -Group instruction provided by PowerPoint slides, verbal discussion, and written materials to support subject matter. The instructor gives patients tips, tricks, and techniques to help them not only survive but enjoy the holidays despite the onslaught of food that accompanies the holidays.   Knowledge Questionnaire Score: Knowledge Questionnaire Score - 07/25/18 0925      Knowledge Questionnaire Score   Pre Score  21/24       Core Components/Risk Factors/Patient Goals at Admission: Personal Goals and Risk Factors at Admission - 07/25/18 0901      Core Components/Risk Factors/Patient Goals on Admission    Weight Management  Yes;Weight Loss    Intervention  Weight Management: Develop a combined nutrition and exercise program designed to reach desired caloric intake, while maintaining appropriate intake of nutrient and fiber, sodium and fats, and appropriate energy expenditure required for  the weight goal.;Weight Management: Provide education and appropriate resources to help participant work on and attain dietary goals.;Weight Management/Obesity: Establish reasonable short term and long term weight goals.;Obesity: Provide education and appropriate resources to help participant work on and attain dietary goals.    Admit Weight  205 lb 7.5 oz (93.2 kg)    Goal Weight: Short Term  200 lb (90.7 kg)    Goal Weight: Long Term  195 lb (88.5 kg)    Expected Outcomes  Short Term: Continue to assess and modify interventions until short term weight is achieved;Long Term: Adherence to nutrition and physical activity/exercise program aimed toward attainment of established weight goal;Weight Loss: Understanding of general recommendations for a balanced deficit meal plan, which promotes 1-2 lb weight loss per week and includes a negative energy balance of [phone] kcal/d;Understanding recommendations for meals to include 15-35% energy as protein, 25-35% energy from fat, 35-60% energy from carbohydrates, less than 200mg  of dietary cholesterol, 20-35 gm of total fiber daily;Understanding of distribution of calorie intake throughout the day with the consumption of 4-5 meals/snacks    Heart Failure  Yes    Intervention  Provide a combined exercise and nutrition program  that is supplemented with education, support and counseling about heart failure. Directed toward relieving symptoms such as shortness of breath, decreased exercise tolerance, and extremity edema.    Expected Outcomes  Improve functional capacity of life;Short term: Attendance in program 2-3 days a week with increased exercise capacity. Reported lower sodium intake. Reported increased fruit and vegetable intake. Reports medication compliance.;Short term: Daily weights obtained and reported for increase. Utilizing diuretic protocols set by physician.;Long term: Adoption of self-care skills and reduction of barriers for early signs and symptoms  recognition and intervention leading to self-care maintenance.    Hypertension  Yes    Intervention  Provide education on lifestyle modifcations including regular physical activity/exercise, weight management, moderate sodium restriction and increased consumption of fresh fruit, vegetables, and low fat dairy, alcohol moderation, and smoking cessation.;Monitor prescription use compliance.    Expected Outcomes  Short Term: Continued assessment and intervention until BP is < 140/62mm HG in hypertensive participants. < 130/55mm HG in hypertensive participants with diabetes, heart failure or chronic kidney disease.;Long Term: Maintenance of blood pressure at goal levels.    Lipids  Yes    Intervention  Provide education and support for participant on nutrition & aerobic/resistive exercise along with prescribed medications to achieve LDL 70mg , HDL >40mg .    Expected Outcomes  Short Term: Participant states understanding of desired cholesterol values and is compliant with medications prescribed. Participant is following exercise prescription and nutrition guidelines.;Long Term: Cholesterol controlled with medications as prescribed, with individualized exercise RX and with personalized nutrition plan. Value goals: LDL < 70mg , HDL > 40 mg.    Stress  Yes    Intervention  Offer individual and/or small group education and counseling on adjustment to heart disease, stress management and health-related lifestyle change. Teach and support self-help strategies.;Refer participants experiencing significant psychosocial distress to appropriate mental health specialists for further evaluation and treatment. When possible, include family members and significant others in education/counseling sessions.    Expected Outcomes  Short Term: Participant demonstrates changes in health-related behavior, relaxation and other stress management skills, ability to obtain effective social support, and compliance with psychotropic  medications if prescribed.;Long Term: Emotional wellbeing is indicated by absence of clinically significant psychosocial distress or social isolation.       Core Components/Risk Factors/Patient Goals Review:  Goals and Risk Factor Review    Row Name 07/30/18 1504 08/29/18 0830           Core Components/Risk Factors/Patient Goals Review   Personal Goals Review  Weight Management/Obesity;Heart Failure;Lipids;Hypertension  Weight Management/Obesity;Heart Failure;Lipids;Hypertension;Stress      Review  Ed started exercise at cardiac rehab on 07/29/18  Ed is doing well with exercise. Ed has an occasional exertional bp elevation otherwise Ed's vital signs have been stable      Expected Outcomes  Ed will continue to participate in phase 2 cardiac rehab for exercise , nutrition and lifestyle modifications  Ed will continue to participate in phase 2 cardiac rehab for exercise , nutrition and lifestyle modifications         Core Components/Risk Factors/Patient Goals at Discharge (Final Review):  Goals and Risk Factor Review - 08/29/18 0830      Core Components/Risk Factors/Patient Goals Review   Personal Goals Review  Weight Management/Obesity;Heart Failure;Lipids;Hypertension;Stress    Review  Ed is doing well with exercise. Ed has an occasional exertional bp elevation otherwise Ed's vital signs have been stable    Expected Outcomes  Ed will continue to participate in phase 2 cardiac rehab for  exercise , nutrition and lifestyle modifications       ITP Comments: ITP Comments    Row Name 07/25/18 0826 07/30/18 1503 08/29/18 0827       ITP Comments  Dr. Fransico Him, Medical Director  30 Day ITP Review. Ed completed his first day of exercise without difficulty  30  Day ITP Review. Patient with good participation and attendance in phase 2 cardiac rehab        Comments: See ITP comments.Barnet Pall, RN,BSN 08/29/2018 8:35 AM

## 2018-08-30 ENCOUNTER — Ambulatory Visit (HOSPITAL_COMMUNITY): Payer: Medicare Other

## 2018-08-30 ENCOUNTER — Encounter (HOSPITAL_COMMUNITY)
Admission: RE | Admit: 2018-08-30 | Discharge: 2018-08-30 | Disposition: A | Discharge status: Home / Self Care | Payer: Medicare Other | Source: Ambulatory Visit | Attending: Interventional Cardiology | Admitting: Interventional Cardiology

## 2018-08-30 DIAGNOSIS — I2102 ST elevation (STEMI) myocardial infarction involving left anterior descending coronary artery: Secondary | ICD-10-CM

## 2018-08-30 DIAGNOSIS — Z955 Presence of coronary angioplasty implant and graft: Secondary | ICD-10-CM

## 2018-09-02 ENCOUNTER — Ambulatory Visit (HOSPITAL_COMMUNITY): Payer: Medicare Other

## 2018-09-02 ENCOUNTER — Encounter (HOSPITAL_COMMUNITY)
Admission: RE | Admit: 2018-09-02 | Discharge: 2018-09-02 | Disposition: A | Discharge status: Home / Self Care | Payer: Medicare Other | Source: Ambulatory Visit | Attending: Interventional Cardiology | Admitting: Interventional Cardiology

## 2018-09-02 DIAGNOSIS — Z955 Presence of coronary angioplasty implant and graft: Secondary | ICD-10-CM

## 2018-09-02 DIAGNOSIS — I2102 ST elevation (STEMI) myocardial infarction involving left anterior descending coronary artery: Secondary | ICD-10-CM | POA: Diagnosis not present

## 2018-09-04 ENCOUNTER — Ambulatory Visit (HOSPITAL_COMMUNITY): Payer: Medicare Other

## 2018-09-04 ENCOUNTER — Encounter (HOSPITAL_COMMUNITY)
Admission: RE | Admit: 2018-09-04 | Discharge: 2018-09-04 | Disposition: A | Discharge status: Home / Self Care | Payer: Medicare Other | Source: Ambulatory Visit | Attending: Interventional Cardiology | Admitting: Interventional Cardiology

## 2018-09-04 DIAGNOSIS — Z955 Presence of coronary angioplasty implant and graft: Secondary | ICD-10-CM

## 2018-09-04 DIAGNOSIS — I2102 ST elevation (STEMI) myocardial infarction involving left anterior descending coronary artery: Secondary | ICD-10-CM

## 2018-09-06 ENCOUNTER — Telehealth: Payer: Self-pay | Admitting: Physician Assistant

## 2018-09-06 ENCOUNTER — Ambulatory Visit (HOSPITAL_COMMUNITY): Payer: Medicare Other

## 2018-09-06 ENCOUNTER — Encounter (HOSPITAL_COMMUNITY)
Admission: RE | Admit: 2018-09-06 | Discharge: 2018-09-06 | Disposition: A | Discharge status: Home / Self Care | Payer: Medicare Other | Source: Ambulatory Visit | Attending: Interventional Cardiology | Admitting: Interventional Cardiology

## 2018-09-06 DIAGNOSIS — Z955 Presence of coronary angioplasty implant and graft: Secondary | ICD-10-CM

## 2018-09-06 DIAGNOSIS — I2102 ST elevation (STEMI) myocardial infarction involving left anterior descending coronary artery: Secondary | ICD-10-CM

## 2018-09-06 NOTE — Telephone Encounter (Signed)
Please call pt and let him know we got a fax from his insurance company raising some concern about duplicate med therapy. In 04/2018 his metoprolol was changed to carvedilol for BP, but the insurance company sent [place] a message saying he also had filled metoprolol in 05/2018. Please confirm pt is on CARVEDILOL, not metoprolol.  Dayna Dunn PA-C

## 2018-09-06 NOTE — Telephone Encounter (Signed)
Called pt re: Metoprolol / Carvedilol. Pt confirms that the Metoprolol was on automatic refill and he didn't pick it up, that he is only taking Carvedilol

## 2018-09-09 ENCOUNTER — Ambulatory Visit (HOSPITAL_COMMUNITY): Payer: Medicare Other

## 2018-09-09 ENCOUNTER — Encounter (HOSPITAL_COMMUNITY)
Admission: RE | Admit: 2018-09-09 | Discharge: 2018-09-09 | Disposition: A | Discharge status: Home / Self Care | Payer: Medicare Other | Source: Ambulatory Visit | Attending: Interventional Cardiology | Admitting: Interventional Cardiology

## 2018-09-09 DIAGNOSIS — Z955 Presence of coronary angioplasty implant and graft: Secondary | ICD-10-CM

## 2018-09-09 DIAGNOSIS — I2102 ST elevation (STEMI) myocardial infarction involving left anterior descending coronary artery: Secondary | ICD-10-CM

## 2018-09-11 ENCOUNTER — Ambulatory Visit (HOSPITAL_COMMUNITY): Payer: Medicare Other

## 2018-09-11 ENCOUNTER — Encounter (HOSPITAL_COMMUNITY)
Admission: RE | Admit: 2018-09-11 | Discharge: 2018-09-11 | Disposition: A | Discharge status: Home / Self Care | Payer: Medicare Other | Source: Ambulatory Visit | Attending: Interventional Cardiology | Admitting: Interventional Cardiology

## 2018-09-11 DIAGNOSIS — I2102 ST elevation (STEMI) myocardial infarction involving left anterior descending coronary artery: Secondary | ICD-10-CM

## 2018-09-11 DIAGNOSIS — Z955 Presence of coronary angioplasty implant and graft: Secondary | ICD-10-CM

## 2018-09-13 ENCOUNTER — Encounter (HOSPITAL_COMMUNITY)
Admission: RE | Admit: 2018-09-13 | Discharge: 2018-09-13 | Disposition: A | Discharge status: Home / Self Care | Payer: Medicare Other | Source: Ambulatory Visit | Attending: Interventional Cardiology | Admitting: Interventional Cardiology

## 2018-09-13 ENCOUNTER — Ambulatory Visit (HOSPITAL_COMMUNITY): Payer: Medicare Other

## 2018-09-13 DIAGNOSIS — I2102 ST elevation (STEMI) myocardial infarction involving left anterior descending coronary artery: Secondary | ICD-10-CM

## 2018-09-13 DIAGNOSIS — Z955 Presence of coronary angioplasty implant and graft: Secondary | ICD-10-CM

## 2018-09-16 ENCOUNTER — Encounter (HOSPITAL_COMMUNITY)
Admission: RE | Admit: 2018-09-16 | Discharge: 2018-09-16 | Disposition: A | Discharge status: Home / Self Care | Payer: Medicare Other | Source: Ambulatory Visit | Attending: Interventional Cardiology | Admitting: Interventional Cardiology

## 2018-09-16 ENCOUNTER — Ambulatory Visit (HOSPITAL_COMMUNITY): Payer: Medicare Other

## 2018-09-16 DIAGNOSIS — I2102 ST elevation (STEMI) myocardial infarction involving left anterior descending coronary artery: Secondary | ICD-10-CM | POA: Diagnosis not present

## 2018-09-16 DIAGNOSIS — Z955 Presence of coronary angioplasty implant and graft: Secondary | ICD-10-CM

## 2018-09-18 ENCOUNTER — Ambulatory Visit (HOSPITAL_COMMUNITY): Payer: Medicare Other

## 2018-09-18 ENCOUNTER — Encounter (HOSPITAL_COMMUNITY)
Admission: RE | Admit: 2018-09-18 | Discharge: 2018-09-18 | Disposition: A | Discharge status: Home / Self Care | Payer: Medicare Other | Source: Ambulatory Visit | Attending: Interventional Cardiology | Admitting: Interventional Cardiology

## 2018-09-18 DIAGNOSIS — Z955 Presence of coronary angioplasty implant and graft: Secondary | ICD-10-CM

## 2018-09-18 DIAGNOSIS — I2102 ST elevation (STEMI) myocardial infarction involving left anterior descending coronary artery: Secondary | ICD-10-CM | POA: Diagnosis not present

## 2018-09-20 ENCOUNTER — Ambulatory Visit (HOSPITAL_COMMUNITY): Payer: Medicare Other

## 2018-09-20 ENCOUNTER — Encounter (HOSPITAL_COMMUNITY)
Admission: RE | Admit: 2018-09-20 | Discharge: 2018-09-20 | Disposition: A | Discharge status: Home / Self Care | Payer: Medicare Other | Source: Ambulatory Visit | Attending: Interventional Cardiology | Admitting: Interventional Cardiology

## 2018-09-20 DIAGNOSIS — I2102 ST elevation (STEMI) myocardial infarction involving left anterior descending coronary artery: Secondary | ICD-10-CM | POA: Diagnosis not present

## 2018-09-20 DIAGNOSIS — Z955 Presence of coronary angioplasty implant and graft: Secondary | ICD-10-CM

## 2018-09-20 NOTE — Progress Notes (Signed)
Cardiac Individual Treatment Plan  Patient Details  Name: Alex Herrera MRN: [id number] Date of Birth: 06/14/1951 Referring Provider:     [place] from 07/25/2018 in [place]  Referring Provider  Larae Grooms MD       Initial Encounter Date:    CARDIAC REHAB [place] from 07/25/2018 in [place]  Date  07/25/18      Visit Diagnosis: ST elevation myocardial infarction involving left anterior descending (LAD) coronary artery [facility])  Status post coronary artery stent placement  Patient's Home Medications on Admission:  Current Outpatient Medications:  .  acetaminophen (TYLENOL) 325 MG tablet, Take 2 tablets (650 mg total) by mouth every 4 (four) hours as needed for headache or mild pain., Disp: , Rfl:  .  aspirin 81 MG chewable tablet, Chew 1 tablet (81 mg total) by mouth daily., Disp: , Rfl:  .  atorvastatin (LIPITOR) 80 MG tablet, Take 80 mg by mouth daily., Disp: , Rfl:  .  Azilsartan Medoxomil (EDARBI) 80 MG TABS, Take 80 mg by mouth daily. , Disp: , Rfl:  .  carvedilol (COREG) 6.25 MG tablet, Take 1 tablet (6.25 mg total) by mouth 2 (two) times daily., Disp: 180 tablet, Rfl: 3 .  Coenzyme Q10 (COQ10 PO), Take 1 capsule by mouth daily., Disp: , Rfl:  .  fluticasone (FLONASE) 50 MCG/ACT nasal spray, Place 2 sprays into both nostrils daily as needed (seasonal allergies). , Disp: , Rfl:  .  gabapentin (NEURONTIN) 300 MG capsule, Take 300 mg by mouth at bedtime., Disp: , Rfl:  .  GLUCOSAMINE-CHONDROITIN PO, Take 2 tablets by mouth daily., Disp: , Rfl:  .  mometasone (ELOCON) 0.1 % cream, Apply 1 application topically daily as needed (wound care)., Disp: , Rfl:  .  prasugrel (EFFIENT) 10 MG TABS tablet, Take 60 mg on first dose tomorrow 06/29/18. Beginning on 06/30/18 start taking 1 tablet daily, Disp: 100 tablet, Rfl: 3 .  Saw Palmetto, Serenoa repens, (SAW PALMETTO PO), Take 2  capsules by mouth daily., Disp: , Rfl:  .  valACYclovir (VALTREX) 1000 MG tablet, Take 1,000 mg by mouth daily. , Disp: , Rfl:   Past Medical History: Past Medical History:  Diagnosis Date  . Acute CHF (congestive heart failure) ([place])    a. presumed diastolic LVEDP 39 during cath 04/2018.  [place] Allergic rhinitis   . Arthritis   . Coronary artery disease    a. PCI to RCA 2015. b. STEMI 04/2018 s/p DESx2 to LAD, EF 55-60%.  . Hearing loss   . History of kidney stones   . Hyperlipidemia   . Hypertension   . Skin cancer, basal cell 2000    Tobacco Use: Social History   Tobacco Use  Smoking Status Never Smoker  Smokeless Tobacco Never Used    Labs: Recent Review Scientist, physiological    Labs for ITP Cardiac and Pulmonary Rehab Latest Ref Rng & Units 04/26/2018   Cholestrol 0 - 200 mg/dL 146   LDLCALC 0 - 99 mg/dL 71   HDL >40 mg/dL 57   Trlycerides <150 mg/dL 92      Capillary Blood Glucose: No results found for: GLUCAP   Exercise Target Goals: Exercise Program Goal: Individual exercise prescription set using results from initial 6 min walk test and THRR while considering  patient's activity barriers and safety.   Exercise Prescription Goal: Initial exercise prescription builds to 30-45 minutes a day of aerobic activity,  2-3 days per week.  Home exercise guidelines will be given to patient during program as part of exercise prescription that the participant will acknowledge.  Activity Barriers & Risk Stratification: Activity Barriers & Cardiac Risk Stratification - 07/25/18 0906      Activity Barriers & Cardiac Risk Stratification   Activity Barriers  None    Cardiac Risk Stratification  High       6 Minute Walk: 6 Minute Walk    Row Name 07/25/18 0905         6 Minute Walk   Phase  Initial     Distance  2000 feet     Walk Time  6 minutes     # of Rest Breaks  0     MPH  3.79     METS  3.92     RPE  11     Perceived Dyspnea   0     VO2 Peak  13.73     Symptoms   No     Resting HR  51 bpm     Resting BP  112/64     Resting Oxygen Saturation   95 %     Exercise Oxygen Saturation  during 6 min walk  96 %     Max Ex. HR  85 bpm     Max Ex. BP  120/70     2 Minute Post BP  112/62        Oxygen Initial Assessment:   Oxygen Re-Evaluation:   Oxygen Discharge (Final Oxygen Re-Evaluation):   Initial Exercise Prescription: Initial Exercise Prescription - 07/25/18 0900      Date of Initial Exercise RX and Referring Provider   Date  07/25/18    Referring Provider  Larae Grooms MD     Expected Discharge Date  11/01/18      Treadmill   MPH  3    Grade  2    Minutes  10    METs  4.12      Bike   Level  1.1    Minutes  10    METs  3.18      Rower   Level  3    Watts  25    Minutes  10    METs  4.4      Prescription Details   Frequency (times per week)  3x    Duration  Progress to 30 minutes of continuous aerobic without signs/symptoms of physical distress      Intensity   THRR 40-80% of Max Heartrate  61-122    Ratings of Perceived Exertion  11-13    Perceived Dyspnea  0-4      Progression   Progression  Continue progressive overload as per policy without signs/symptoms or physical distress.      Resistance Training   Training Prescription  Yes    Weight  5lbs    Reps  10-15       Perform Capillary Blood Glucose checks as needed.  Exercise Prescription Changes:  Exercise Prescription Changes    Row Name 07/29/18 0955 08/05/18 0952 08/19/18 0952 09/02/18 0954 09/16/18 0955     Response to Exercise   Blood Pressure (Admit)  125/74  128/80  120/74  118/82  124/68   Blood Pressure (Exercise)  152/60  164/82  146/82  142/62  176/80   Blood Pressure (Exit)  118/68  104/60  102/62  108/60  108/56   Heart Rate (Admit)  70 bpm  59 bpm  87 bpm  69 bpm  69 bpm   Heart Rate (Exercise)  99 bpm  104 bpm  103 bpm  100 bpm  103 bpm   Heart Rate (Exit)  61 bpm  59 bpm  67 bpm  72 bpm  76 bpm   Rating of Perceived Exertion  (Exercise)  13  13  12  13  13    Symptoms  none  none  none  none  none   Duration  Progress to 30 minutes of  aerobic without signs/symptoms of physical distress  Progress to 30 minutes of  aerobic without signs/symptoms of physical distress  Progress to 30 minutes of  aerobic without signs/symptoms of physical distress  Progress to 30 minutes of  aerobic without signs/symptoms of physical distress  Progress to 30 minutes of  aerobic without signs/symptoms of physical distress   Intensity  THRR unchanged  THRR unchanged  THRR unchanged  THRR unchanged  THRR unchanged     Progression   Progression  Continue to progress workloads to maintain intensity without signs/symptoms of physical distress.  Continue to progress workloads to maintain intensity without signs/symptoms of physical distress.  Continue to progress workloads to maintain intensity without signs/symptoms of physical distress.  Continue to progress workloads to maintain intensity without signs/symptoms of physical distress.  Continue to progress workloads to maintain intensity without signs/symptoms of physical distress.   Average METs  4  4.5  4.3  4.5  4.9     Resistance Training   Training Prescription  Yes  Yes  Yes  Yes  Yes   Weight  5lbs  5lbs  5lbs  5lbs  5lbs   Reps  10-15  10-15  10-15  10-15  10-15   Time  10 Minutes  10 Minutes  10 Minutes  10 Minutes  10 Minutes     Interval Training   Interval Training  No  No  No  No  No     Treadmill   MPH  3  3  3.3  3.4  3.4   Grade  2  3  3.5  3.5  4   Minutes  10  10  10  10  10    METs  4.12  4.54  5.12  5.24  5.48     Bike   Level  1.1  1.5  1.5  1.5  1.8   Minutes  10  10  10  10  10    METs  3.22  4.06  4.03  4.03  4.7     NuStep   Level  -  -  4  5  5    SPM  -  -  85  85  85   Minutes  -  -  10  10  10    METs  -  -  3.7  4.2  4.4     Rower   Level  3  3  -  -  -   Watts  30  35  -  -  -   Minutes  10  10  -  -  -   METs  4.62  4.81  -  -  -     Home Exercise  Plan   Plans to continue exercise at  [facility] (comment) Patient exercising at gym and walking at home.  [employer] (comment) Patient exercising at gym and walking at home.  [employer] (comment) Patient  exercising at gym and walking at home.  [employer] (comment) Patient exercising at gym and walking at home.   Frequency  -  Add 4 additional days to program exercise sessions.  Add 4 additional days to program exercise sessions.  Add 4 additional days to program exercise sessions.  Add 4 additional days to program exercise sessions.   Initial Home Exercises Provided  -  -  -  08/07/18  08/07/18      Exercise Comments:  Exercise Comments    Row Name 07/29/18 1043 08/05/18 1006 08/07/18 1005 08/19/18 1011 09/02/18 0945   Exercise Comments  Patient tolerated 1st session of exercise well without c/o.  Reviewed METs and goals with patient.  Reviewed home exercise guidelines, METs, and goals with patient.  Reviewed METs with patient.  Reviewed METs and goals with patient.   [place] Name 09/16/18 1035           Exercise Comments  METs reviewed with patient.          Exercise Goals and Review:  Exercise Goals    Row Name 07/25/18 0906             Exercise Goals   Increase Physical Activity  Yes       Intervention  Provide advice, education, support and counseling about physical activity/exercise needs.;Develop an individualized exercise prescription for aerobic and resistive training based on initial evaluation findings, risk stratification, comorbidities and participant's personal goals.       Expected Outcomes  Short Term: Attend rehab on a regular basis to increase amount of physical activity.;Long Term: Add in home exercise to make exercise part of routine and to increase amount of physical activity.;Long Term: Exercising regularly at least 3-5 days a week.       Increase Strength and Stamina  Yes       Intervention  Provide advice, education, support  and counseling about physical activity/exercise needs.;Develop an individualized exercise prescription for aerobic and resistive training based on initial evaluation findings, risk stratification, comorbidities and participant's personal goals.       Expected Outcomes  Short Term: Increase workloads from initial exercise prescription for resistance, speed, and METs.;Short Term: Perform resistance training exercises routinely during rehab and add in resistance training at home;Long Term: Improve cardiorespiratory fitness, muscular endurance and strength as measured by increased METs and functional capacity (6MWT)       Able to understand and use rate of perceived exertion (RPE) scale  Yes       Intervention  Provide education and explanation on how to use RPE scale       Expected Outcomes  Short Term: Able to use RPE daily in rehab to express subjective intensity level;Long Term:  Able to use RPE to guide intensity level when exercising independently       Knowledge and understanding of Target Heart Rate Range (THRR)  Yes       Intervention  Provide education and explanation of THRR including how the numbers were predicted and where they are located for reference       Expected Outcomes  Short Term: Able to state/look up THRR;Short Term: Able to use daily as guideline for intensity in rehab;Long Term: Able to use THRR to govern intensity when exercising independently       Able to check pulse independently  Yes       Intervention  Provide education and demonstration on how to check pulse in carotid and radial arteries.;Review the importance of being  able to check your own pulse for safety during independent exercise       Expected Outcomes  Short Term: Able to explain why pulse checking is important during independent exercise;Long Term: Able to check pulse independently and accurately       Understanding of Exercise Prescription  Yes       Intervention  Provide education, explanation, and written  materials on patient's individual exercise prescription       Expected Outcomes  Short Term: Able to explain program exercise prescription;Long Term: Able to explain home exercise prescription to exercise independently          Exercise Goals Re-Evaluation : Exercise Goals Re-Evaluation    Row Name 07/29/18 1000 08/05/18 1006 08/07/18 1005 09/02/18 0945       Exercise Goal Re-Evaluation   Exercise Goals Review  Increase Physical Activity;Able to understand and use rate of perceived exertion (RPE) scale;Knowledge and understanding of Target Heart Rate Range (THRR)  Increase Physical Activity;Able to understand and use rate of perceived exertion (RPE) scale;Knowledge and understanding of Target Heart Rate Range (THRR)  Increase Physical Activity;Able to understand and use rate of perceived exertion (RPE) scale;Knowledge and understanding of Target Heart Rate Range (THRR);Understanding of Exercise Prescription  Increase Physical Activity;Able to understand and use rate of perceived exertion (RPE) scale;Knowledge and understanding of Target Heart Rate Range (THRR);Understanding of Exercise Prescription    Comments  Reviewed RPE scale and THRR. Patient able to understand and use RPE scale appropriately.  Patient is exercising 60 minutes, 2 days/week at the gym: stretching, elliptical, and weight training, and walking 45-60 minutes, 2 days/week at home as his mode of home exercise.   Reviewed home exercise guidelines with patient including THRR, RPE scale, and endpoints for exercise. Pt is exercising at the gym and walking at home on the days he doesn't attend cardiac rehab.  Patient is progressing well with exercise. Patient exercises 2 days/week at the gym: 71mins stretching, 45mins on elliptical, 61mins wt training and 2 days/week walking 57mins. Encouraged patient to increase aerobic exercise at gym from 19mins to 24mins @ the gym to help achieve wt loss goal.    Expected Outcomes  Increase workloads as  tolerated to help achieve personal health and fitness goals.  Patient will conitnue daily exercise routine to help improve cardiorespiratory fitness.  Continue current exercise routine, increasing workloads at cardiac rehab as tolerated.  Patient will increase exercise duration at the gym to help achieve weight loss goal.       Discharge Exercise Prescription (Final Exercise Prescription Changes): Exercise Prescription Changes - 09/16/18 0955      Response to Exercise   Blood Pressure (Admit)  124/68    Blood Pressure (Exercise)  176/80    Blood Pressure (Exit)  108/56    Heart Rate (Admit)  69 bpm    Heart Rate (Exercise)  103 bpm    Heart Rate (Exit)  76 bpm    Rating of Perceived Exertion (Exercise)  13    Symptoms  none    Duration  Progress to 30 minutes of  aerobic without signs/symptoms of physical distress    Intensity  THRR unchanged      Progression   Progression  Continue to progress workloads to maintain intensity without signs/symptoms of physical distress.    Average METs  4.9      Resistance Training   Training Prescription  Yes    Weight  5lbs    Reps  10-15  Time  10 Minutes      Interval Training   Interval Training  No      Treadmill   MPH  3.4    Grade  4    Minutes  10    METs  5.48      Bike   Level  1.8    Minutes  10    METs  4.7      NuStep   Level  5    SPM  85    Minutes  10    METs  4.4      Home Exercise Plan   Plans to continue exercise at  [employer] (comment)   Patient exercising at gym and walking at home.   Frequency  Add 4 additional days to program exercise sessions.    Initial Home Exercises Provided  08/07/18       Nutrition:  Target Goals: Understanding of nutrition guidelines, daily intake of sodium 1500mg , cholesterol 200mg , calories 30% from fat and 7% or less from saturated fats, daily to have 5 or more servings of fruits and vegetables.  Biometrics: Pre Biometrics - 07/25/18 0902      Pre Biometrics    Height  5' 9.75" (1.772 m)    Weight  205 lb 7.5 oz (93.2 kg)    Waist Circumference  39 inches    Hip Circumference  41 inches    Waist to Hip Ratio  0.95 %    BMI (Calculated)  29.68    Triceps Skinfold  29 mm    % Body Fat  30.3 %    Grip Strength  29 kg    Flexibility  9 in    Single Leg Stand  27.74 seconds        Nutrition Therapy Plan and Nutrition Goals: Nutrition Therapy & Goals - 07/25/18 1111      Nutrition Therapy   Diet  heart healthy      Personal Nutrition Goals   Nutrition Goal  Pt to identify and limit food sources of saturated fat, trans fat, refined carbohydrates and sodium    Personal Goal #2  Add a Snack between meals to help manage hunger    Personal Goal #3  Pt to identify food quantities necessary to achieve weight loss of 6-24 lbs. at graduation from cardiac rehab      [place], educate and counsel regarding individualized specific dietary modifications aiming towards targeted core components such as weight, hypertension, lipid management, diabetes, heart failure and other comorbidities.    Expected Outcomes  Short Term Goal: Understand basic principles of dietary content, such as calories, fat, sodium, cholesterol and nutrients.;Long Term Goal: Adherence to prescribed nutrition plan.       Nutrition Assessments: Nutrition Assessments - 07/25/18 1119      MEDFICTS Scores   Pre Score  40       Nutrition Goals Re-Evaluation: Nutrition Goals Re-Evaluation    Row Name 07/25/18 1111             Goals   Current Weight  205 lb 7.5 oz (93.2 kg)          Nutrition Goals Re-Evaluation: Nutrition Goals Re-Evaluation    [place] Name 07/25/18 1111             Goals   Current Weight  205 lb 7.5 oz (93.2 kg)          Nutrition Goals Discharge (Final Nutrition Goals Re-Evaluation):  Nutrition Goals Re-Evaluation - 07/25/18 1111      Goals   Current Weight  205 lb 7.5 oz (93.2 kg)        Psychosocial: Target Goals: Acknowledge presence or absence of significant depression and/or stress, maximize coping skills, provide positive support system. Participant is able to verbalize types and ability to use techniques and skills needed for reducing stress and depression.  Initial Review & Psychosocial Screening: Initial Psych Review & Screening - 07/25/18 0939      Initial Review   Current issues with  None Identified      Family Dynamics   Good Support System?  Yes   Alex Herrera lists his spouse, children, and friends as sources of support.      Barriers   Psychosocial barriers to participate in program  There are no identifiable barriers or psychosocial needs.      Screening Interventions   Interventions  Encouraged to exercise       Quality of Life Scores: Quality of Life - 07/25/18 0928      Quality of Life   Select  Quality of Life      Quality of Life Scores   Health/Function Pre  27.9 %    Socioeconomic Pre  29.38 %    Psych/Spiritual Pre  29.14 %    Family Pre  30 %    GLOBAL Pre  28.79 %      Scores of 19 and below usually indicate a poorer quality of life in these areas.  A difference of  2-3 points is a clinically meaningful difference.  A difference of 2-3 points in the total score of the Quality of Life Index has been associated with significant improvement in overall quality of life, self-image, physical symptoms, and general health in studies assessing change in quality of life.  PHQ-9: Recent Review Flowsheet Data    There is no flowsheet data to display.     Interpretation of Total Score  Total Score Depression Severity:  1-4 = Minimal depression, 5-9 = Mild depression, 10-14 = Moderate depression, 15-19 = Moderately severe depression, 20-27 = Severe depression   Psychosocial Evaluation and Intervention:   Psychosocial Re-Evaluation: Psychosocial Re-Evaluation    Row Name 08/29/18 0829 09/20/18 1641           Psychosocial Re-Evaluation    Current issues with  None Identified  None Identified      Interventions  Encouraged to attend Cardiac Rehabilitation for the exercise  Encouraged to attend Cardiac Rehabilitation for the exercise      Continue Psychosocial Services   No Follow up required  No Follow up required         Psychosocial Discharge (Final Psychosocial Re-Evaluation): Psychosocial Re-Evaluation - 09/20/18 1641      Psychosocial Re-Evaluation   Current issues with  None Identified    Interventions  Encouraged to attend Cardiac Rehabilitation for the exercise    Continue Psychosocial Services   No Follow up required       Vocational Rehabilitation: Provide vocational rehab assistance to qualifying candidates.   Vocational Rehab Evaluation & Intervention: Vocational Rehab - 07/25/18 0939      Initial Vocational Rehab Evaluation & Intervention   Assessment shows need for Vocational Rehabilitation  No       Education: Education Goals: Education classes will be provided on a weekly basis, covering required topics. Participant will state understanding/return demonstration of topics presented.  Learning Barriers/Preferences: Learning Barriers/Preferences - 07/25/18 6283  Learning Barriers/Preferences   Learning Barriers  None    Learning Preferences  Verbal Instruction;Skilled Demonstration;Individual Instruction;Written Material       Education Topics: Count Your Pulse:  -Group instruction provided by verbal instruction, demonstration, patient participation and written materials to support subject.  Instructors address importance of being able to find your pulse and how to count your pulse when at home without a heart monitor.  Patients get hands on experience counting their pulse with staff help and individually.   CARDIAC REHAB PHASE II EXERCISE from 09/04/2018 in [place]  Date  08/16/18  Educator  RN  Instruction Review Code  2- Demonstrated Understanding       Heart Attack, Angina, and Risk Factor Modification:  -Group instruction provided by verbal instruction, video, and written materials to support subject.  Instructors address signs and symptoms of angina and heart attacks.    Also discuss risk factors for heart disease and how to make changes to improve heart health risk factors.   Functional Fitness:  -Group instruction provided by verbal instruction, demonstration, patient participation, and written materials to support subject.  Instructors address safety measures for doing things around the house.  Discuss how to get up and down off the floor, how to pick things up properly, how to safely get out of a chair without assistance, and balance training.   CARDIAC REHAB PHASE II EXERCISE from 09/04/2018 in [place]  Date  08/09/18  Educator  EP  Instruction Review Code  2- Demonstrated Understanding      Meditation and Mindfulness:  -Group instruction provided by verbal instruction, patient participation, and written materials to support subject.  Instructor addresses importance of mindfulness and meditation practice to help reduce stress and improve awareness.  Instructor also leads participants through a meditation exercise.    CARDIAC REHAB PHASE II EXERCISE from 09/04/2018 in [place]  Date  08/07/18  Instruction Review Code  2- Demonstrated Understanding      Stretching for Flexibility and Mobility:  -Group instruction provided by verbal instruction, patient participation, and written materials to support subject.  Instructors lead participants through series of stretches that are designed to increase flexibility thus improving mobility.  These stretches are additional exercise for major muscle groups that are typically performed during regular warm up and cool down.   Hands Only CPR:  -Group verbal, video, and participation provides a basic overview of AHA guidelines  for community CPR. Role-play of emergencies allow participants the opportunity to practice calling for help and chest compression technique with discussion of AED use.   Hypertension: -Group verbal and written instruction that provides a basic overview of hypertension including the most recent diagnostic guidelines, risk factor reduction with self-care instructions and medication management.   CARDIAC REHAB PHASE II EXERCISE from 09/04/2018 in [place]  Date  08/30/18  Educator  RN  Instruction Review Code  2- Demonstrated Understanding       Nutrition I class: Heart Healthy Eating:  -Group instruction provided by PowerPoint slides, verbal discussion, and written materials to support subject matter. The instructor gives an explanation and review of the Therapeutic Lifestyle Changes diet recommendations, which includes a discussion on lipid goals, dietary fat, sodium, fiber, plant stanol/sterol esters, sugar, and the components of a well-balanced, healthy diet.   Nutrition II class: Lifestyle Skills:  -Group instruction provided by PowerPoint slides, verbal discussion, and written materials to support subject  matter. The instructor gives an explanation and review of label reading, grocery shopping for heart health, heart healthy recipe modifications, and ways to make healthier choices when eating out.   Diabetes Question & Answer:  -Group instruction provided by PowerPoint slides, verbal discussion, and written materials to support subject matter. The instructor gives an explanation and review of diabetes co-morbidities, pre- and post-prandial blood glucose goals, pre-exercise blood glucose goals, signs, symptoms, and treatment of hypoglycemia and hyperglycemia, and foot care basics.   Diabetes Blitz:  -Group instruction provided by PowerPoint slides, verbal discussion, and written materials to support subject matter. The instructor gives an explanation and  review of the physiology behind type 1 and type 2 diabetes, diabetes medications and rational behind using different medications, pre- and post-prandial blood glucose recommendations and Hemoglobin A1c goals, diabetes diet, and exercise including blood glucose guidelines for exercising safely.    Portion Distortion:  -Group instruction provided by PowerPoint slides, verbal discussion, written materials, and food models to support subject matter. The instructor gives an explanation of serving size versus portion size, changes in portions sizes over the last 20 years, and what consists of a serving from each food group.   Stress Management:  -Group instruction provided by verbal instruction, video, and written materials to support subject matter.  Instructors review role of stress in heart disease and how to cope with stress positively.     CARDIAC REHAB PHASE II EXERCISE from 09/04/2018 in [place]  Date  08/21/18  Educator  RN  Instruction Review Code  2- Demonstrated Understanding      Exercising on Your Own:  -Group instruction provided by verbal instruction, power point, and written materials to support subject.  Instructors discuss benefits of exercise, components of exercise, frequency and intensity of exercise, and end points for exercise.  Also discuss use of nitroglycerin and activating EMS.  Review options of places to exercise outside of rehab.  Review guidelines for sex with heart disease.   Cardiac Drugs I:  -Group instruction provided by verbal instruction and written materials to support subject.  Instructor reviews cardiac drug classes: antiplatelets, anticoagulants, beta blockers, and statins.  Instructor discusses reasons, side effects, and lifestyle considerations for each drug class.   CARDIAC REHAB PHASE II EXERCISE from 09/04/2018 in [place]  Date  07/31/18  Instruction Review Code  2- Demonstrated  Understanding      Cardiac Drugs II:  -Group instruction provided by verbal instruction and written materials to support subject.  Instructor reviews cardiac drug classes: angiotensin converting enzyme inhibitors (ACE-I), angiotensin II receptor blockers (ARBs), nitrates, and calcium channel blockers.  Instructor discusses reasons, side effects, and lifestyle considerations for each drug class.   CARDIAC REHAB PHASE II EXERCISE from 09/04/2018 in [place]  Date  09/04/18  Instruction Review Code  2- Demonstrated Understanding      Anatomy and Physiology of the Circulatory System:  Group verbal and written instruction and models provide basic cardiac anatomy and physiology, with the coronary electrical and arterial systems. Review of: AMI, Angina, Valve disease, Heart Failure, Peripheral Artery Disease, Cardiac Arrhythmia, Pacemakers, and the ICD.   CARDIAC REHAB PHASE II EXERCISE from 09/04/2018 in [place]  Date  08/28/18  Educator  RN  Instruction Review Code  2- Demonstrated Understanding      Other Education:  -Group or individual verbal, written, or video instructions that support the educational goals  of the cardiac rehab program.   Holiday Eating Survival Tips:  -Group instruction provided by PowerPoint slides, verbal discussion, and written materials to support subject matter. The instructor gives patients tips, tricks, and techniques to help them not only survive but enjoy the holidays despite the onslaught of food that accompanies the holidays.   Knowledge Questionnaire Score: Knowledge Questionnaire Score - 07/25/18 0925      Knowledge Questionnaire Score   Pre Score  21/24       Core Components/Risk Factors/Patient Goals at Admission: Personal Goals and Risk Factors at Admission - 07/25/18 0901      Core Components/Risk Factors/Patient Goals on Admission    Weight Management  Yes;Weight Loss     Intervention  Weight Management: Develop a combined nutrition and exercise program designed to reach desired caloric intake, while maintaining appropriate intake of nutrient and fiber, sodium and fats, and appropriate energy expenditure required for the weight goal.;Weight Management: Provide education and appropriate resources to help participant work on and attain dietary goals.;Weight Management/Obesity: Establish reasonable short term and long term weight goals.;Obesity: Provide education and appropriate resources to help participant work on and attain dietary goals.    Admit Weight  205 lb 7.5 oz (93.2 kg)    Goal Weight: Short Term  200 lb (90.7 kg)    Goal Weight: Long Term  195 lb (88.5 kg)    Expected Outcomes  Short Term: Continue to assess and modify interventions until short term weight is achieved;Long Term: Adherence to nutrition and physical activity/exercise program aimed toward attainment of established weight goal;Weight Loss: Understanding of general recommendations for a balanced deficit meal plan, which promotes 1-2 lb weight loss per week and includes a negative energy balance of [phone] kcal/d;Understanding recommendations for meals to include 15-35% energy as protein, 25-35% energy from fat, 35-60% energy from carbohydrates, less than 200mg  of dietary cholesterol, 20-35 gm of total fiber daily;Understanding of distribution of calorie intake throughout the day with the consumption of 4-5 meals/snacks    Heart Failure  Yes    Intervention  Provide a combined exercise and nutrition program that is supplemented with education, support and counseling about heart failure. Directed toward relieving symptoms such as shortness of breath, decreased exercise tolerance, and extremity edema.    Expected Outcomes  Improve functional capacity of life;Short term: Attendance in program 2-3 days a week with increased exercise capacity. Reported lower sodium intake. Reported increased fruit and  vegetable intake. Reports medication compliance.;Short term: Daily weights obtained and reported for increase. Utilizing diuretic protocols set by physician.;Long term: Adoption of self-care skills and reduction of barriers for early signs and symptoms recognition and intervention leading to self-care maintenance.    Hypertension  Yes    Intervention  Provide education on lifestyle modifcations including regular physical activity/exercise, weight management, moderate sodium restriction and increased consumption of fresh fruit, vegetables, and low fat dairy, alcohol moderation, and smoking cessation.;Monitor prescription use compliance.    Expected Outcomes  Short Term: Continued assessment and intervention until BP is < 140/23mm HG in hypertensive participants. < 130/81mm HG in hypertensive participants with diabetes, heart failure or chronic kidney disease.;Long Term: Maintenance of blood pressure at goal levels.    Lipids  Yes    Intervention  Provide education and support for participant on nutrition & aerobic/resistive exercise along with prescribed medications to achieve LDL 70mg , HDL >40mg .    Expected Outcomes  Short Term: Participant states understanding of desired cholesterol values and is compliant with medications prescribed. Participant  is following exercise prescription and nutrition guidelines.;Long Term: Cholesterol controlled with medications as prescribed, with individualized exercise RX and with personalized nutrition plan. Value goals: LDL < 70mg , HDL > 40 mg.    Stress  Yes    Intervention  Offer individual and/or small group education and counseling on adjustment to heart disease, stress management and health-related lifestyle change. Teach and support self-help strategies.;Refer participants experiencing significant psychosocial distress to appropriate mental health specialists for further evaluation and treatment. When possible, include family members and significant others in  education/counseling sessions.    Expected Outcomes  Short Term: Participant demonstrates changes in health-related behavior, relaxation and other stress management skills, ability to obtain effective social support, and compliance with psychotropic medications if prescribed.;Long Term: Emotional wellbeing is indicated by absence of clinically significant psychosocial distress or social isolation.       Core Components/Risk Factors/Patient Goals Review:  Goals and Risk Factor Review    Row Name 07/30/18 1504 08/29/18 0830 09/20/18 1641         Core Components/Risk Factors/Patient Goals Review   Personal Goals Review  Weight Management/Obesity;Heart Failure;Lipids;Hypertension  Weight Management/Obesity;Heart Failure;Lipids;Hypertension;Stress  Weight Management/Obesity;Heart Failure;Lipids;Hypertension;Stress     Review  Alex Herrera started exercise at cardiac rehab on 07/29/18  Alex Herrera is doing well with exercise. Alex Herrera has an occasional exertional bp elevation otherwise Alex Herrera's vital signs have been stable  Alex Herrera is doing well with exercise. Alex Herrera has an occasional exertional bp elevation otherwise Alex Herrera's vital signs have been stable     Expected Outcomes  Alex Herrera will continue to participate in phase 2 cardiac rehab for exercise , nutrition and lifestyle modifications  Alex Herrera will continue to participate in phase 2 cardiac rehab for exercise , nutrition and lifestyle modifications  Alex Herrera will continue to participate in phase 2 cardiac rehab for exercise , nutrition and lifestyle modifications        Core Components/Risk Factors/Patient Goals at Discharge (Final Review):  Goals and Risk Factor Review - 09/20/18 1641      Core Components/Risk Factors/Patient Goals Review   Personal Goals Review  Weight Management/Obesity;Heart Failure;Lipids;Hypertension;Stress    Review  Alex Herrera is doing well with exercise. Alex Herrera has an occasional exertional bp elevation otherwise Alex Herrera's vital signs have been stable    Expected Outcomes  Alex Herrera will continue to  participate in phase 2 cardiac rehab for exercise , nutrition and lifestyle modifications       ITP Comments: ITP Comments    Row Name 07/25/18 0826 07/30/18 1503 08/29/18 0827 09/20/18 1633     ITP Comments  Dr. Fransico Him, Medical Director  30 Day ITP Review. Alex Herrera completed his first day of exercise without difficulty  30  Day ITP Review. Patient with good participation and attendance in phase 2 cardiac rehab  30  Day ITP Review. Patient with good participation and attendance in phase 2 cardiac rehab       Comments: See ITP comments.Barnet Pall, RN,BSN 09/26/2018 11:43 AM

## 2018-09-23 ENCOUNTER — Ambulatory Visit (HOSPITAL_COMMUNITY): Payer: Medicare Other

## 2018-09-23 ENCOUNTER — Encounter (HOSPITAL_COMMUNITY)
Admission: RE | Admit: 2018-09-23 | Discharge: 2018-09-23 | Disposition: A | Discharge status: Home / Self Care | Payer: Medicare Other | Source: Ambulatory Visit | Attending: Interventional Cardiology | Admitting: Interventional Cardiology

## 2018-09-23 DIAGNOSIS — Z955 Presence of coronary angioplasty implant and graft: Secondary | ICD-10-CM | POA: Insufficient documentation

## 2018-09-23 DIAGNOSIS — I2102 ST elevation (STEMI) myocardial infarction involving left anterior descending coronary artery: Secondary | ICD-10-CM | POA: Insufficient documentation

## 2018-09-25 ENCOUNTER — Ambulatory Visit (HOSPITAL_COMMUNITY): Payer: Medicare Other

## 2018-09-25 ENCOUNTER — Encounter (HOSPITAL_COMMUNITY)
Admission: RE | Admit: 2018-09-25 | Discharge: 2018-09-25 | Disposition: A | Discharge status: Home / Self Care | Payer: Medicare Other | Source: Ambulatory Visit | Attending: Interventional Cardiology | Admitting: Interventional Cardiology

## 2018-09-25 DIAGNOSIS — Z955 Presence of coronary angioplasty implant and graft: Secondary | ICD-10-CM

## 2018-09-25 DIAGNOSIS — I2102 ST elevation (STEMI) myocardial infarction involving left anterior descending coronary artery: Secondary | ICD-10-CM

## 2018-09-27 ENCOUNTER — Encounter (HOSPITAL_COMMUNITY)
Admission: RE | Admit: 2018-09-27 | Discharge: 2018-09-27 | Disposition: A | Discharge status: Home / Self Care | Payer: Medicare Other | Source: Ambulatory Visit | Attending: Interventional Cardiology | Admitting: Interventional Cardiology

## 2018-09-27 ENCOUNTER — Ambulatory Visit (HOSPITAL_COMMUNITY): Payer: Medicare Other

## 2018-09-27 DIAGNOSIS — Z955 Presence of coronary angioplasty implant and graft: Secondary | ICD-10-CM

## 2018-09-27 DIAGNOSIS — I2102 ST elevation (STEMI) myocardial infarction involving left anterior descending coronary artery: Secondary | ICD-10-CM | POA: Diagnosis not present

## 2018-09-30 ENCOUNTER — Ambulatory Visit (HOSPITAL_COMMUNITY): Payer: Medicare Other

## 2018-09-30 ENCOUNTER — Encounter (HOSPITAL_COMMUNITY)
Admission: RE | Admit: 2018-09-30 | Discharge: 2018-09-30 | Disposition: A | Discharge status: Home / Self Care | Payer: Medicare Other | Source: Ambulatory Visit | Attending: Interventional Cardiology | Admitting: Interventional Cardiology

## 2018-09-30 DIAGNOSIS — I2102 ST elevation (STEMI) myocardial infarction involving left anterior descending coronary artery: Secondary | ICD-10-CM | POA: Diagnosis not present

## 2018-09-30 DIAGNOSIS — Z955 Presence of coronary angioplasty implant and graft: Secondary | ICD-10-CM

## 2018-10-02 ENCOUNTER — Encounter (HOSPITAL_COMMUNITY)
Admission: RE | Admit: 2018-10-02 | Discharge: 2018-10-02 | Disposition: A | Discharge status: Home / Self Care | Payer: Medicare Other | Source: Ambulatory Visit | Attending: Interventional Cardiology | Admitting: Interventional Cardiology

## 2018-10-02 ENCOUNTER — Other Ambulatory Visit: Payer: Self-pay

## 2018-10-02 ENCOUNTER — Ambulatory Visit (HOSPITAL_COMMUNITY): Payer: Medicare Other

## 2018-10-02 DIAGNOSIS — I2102 ST elevation (STEMI) myocardial infarction involving left anterior descending coronary artery: Secondary | ICD-10-CM

## 2018-10-02 DIAGNOSIS — Z955 Presence of coronary angioplasty implant and graft: Secondary | ICD-10-CM

## 2018-10-04 ENCOUNTER — Encounter (HOSPITAL_COMMUNITY)
Admission: RE | Admit: 2018-10-04 | Discharge: 2018-10-04 | Disposition: A | Discharge status: Home / Self Care | Payer: Medicare Other | Source: Ambulatory Visit | Attending: Interventional Cardiology | Admitting: Interventional Cardiology

## 2018-10-04 ENCOUNTER — Other Ambulatory Visit: Payer: Self-pay

## 2018-10-04 ENCOUNTER — Ambulatory Visit (HOSPITAL_COMMUNITY): Payer: Medicare Other

## 2018-10-04 DIAGNOSIS — I2102 ST elevation (STEMI) myocardial infarction involving left anterior descending coronary artery: Secondary | ICD-10-CM | POA: Diagnosis not present

## 2018-10-04 DIAGNOSIS — Z955 Presence of coronary angioplasty implant and graft: Secondary | ICD-10-CM

## 2018-10-07 ENCOUNTER — Ambulatory Visit (HOSPITAL_COMMUNITY): Payer: Medicare Other

## 2018-10-07 ENCOUNTER — Telehealth (HOSPITAL_COMMUNITY): Payer: Self-pay | Admitting: Cardiac Rehabilitation

## 2018-10-07 ENCOUNTER — Encounter (HOSPITAL_COMMUNITY): Payer: Medicare Other

## 2018-10-07 NOTE — Telephone Encounter (Signed)
Phone call to patient to notify of [facility] Phase II departmental closing for 2 weeks.  Pt verbalized understanding.  Joann Rion, RN, BSN Cardiac Pulmonary Rehab  

## 2018-10-09 ENCOUNTER — Ambulatory Visit (HOSPITAL_COMMUNITY): Payer: Medicare Other

## 2018-10-09 ENCOUNTER — Encounter (HOSPITAL_COMMUNITY): Payer: Medicare Other

## 2018-10-10 ENCOUNTER — Encounter (HOSPITAL_COMMUNITY): Payer: Self-pay | Admitting: *Deleted

## 2018-10-10 DIAGNOSIS — I2102 ST elevation (STEMI) myocardial infarction involving left anterior descending coronary artery: Secondary | ICD-10-CM

## 2018-10-10 DIAGNOSIS — Z955 Presence of coronary angioplasty implant and graft: Secondary | ICD-10-CM

## 2018-10-11 ENCOUNTER — Telehealth: Payer: Self-pay

## 2018-10-11 ENCOUNTER — Ambulatory Visit (HOSPITAL_COMMUNITY): Payer: Medicare Other

## 2018-10-11 ENCOUNTER — Encounter (HOSPITAL_COMMUNITY): Payer: Medicare Other

## 2018-10-11 NOTE — Telephone Encounter (Signed)
   Appointment Cancelled due to Coronavirus:  Called patient in regards to f/u appointment with Dr. Irish Lack on 10/17/18.   Patient has been rescheduled for 6/1. Patient will let us know if he develops any Sx prior to that time.  No refills needed.   Cardiac Questionnaire:    Since your last visit or hospitalization:    1. Have you been having new or worsening chest pain? NO   2. Have you been having new or worsening shortness of breath? NO 3. Have you been having new or worsening leg swelling, wt gain, or increase in abdominal girth (pants fitting more tightly)? NO   4. Have you had any passing out spells? NO    *A YES to any of these questions would result in the appointment being kept. *If all the answers to these questions are NO, we should indicate that given the current situation regarding the worldwide coronarvirus pandemic, at the recommendation of the CDC, we are looking to limit gatherings in our waiting area, and thus will reschedule their appointment beyond four weeks from today.   _____________   [id number] Pre-Screening Questions:  . Do you currently have a fever? NO (yes = cancel and refer to pcp for e-visit) . Have you recently travelled on a cruise, internationally, or to [place], [place], [place], [place], [place], or [place], [place] [employer]) ? NO (yes = cancel, stay home, monitor symptoms, and contact pcp or initiate e-visit if symptoms develop) . Have you been in contact with someone that is currently pending confirmation of Covid19 testing or has been confirmed to have the Luck virus?  NO (yes = cancel, stay home, away from tested individual, monitor symptoms, and contact pcp or initiate e-visit if symptoms develop) . Are you currently experiencing fatigue or cough? NO (yes = pt should be prepared to have a mask placed at the time of their visit).

## 2018-10-11 NOTE — Telephone Encounter (Signed)
Left message for patient to call back regarding his appointment with Dr. Irish Lack on 3/26.

## 2018-10-14 ENCOUNTER — Encounter (HOSPITAL_COMMUNITY): Payer: Medicare Other

## 2018-10-14 ENCOUNTER — Ambulatory Visit (HOSPITAL_COMMUNITY): Payer: Medicare Other

## 2018-10-15 ENCOUNTER — Telehealth (HOSPITAL_COMMUNITY): Payer: Self-pay | Admitting: *Deleted

## 2018-10-15 ENCOUNTER — Encounter (HOSPITAL_COMMUNITY): Payer: Self-pay | Admitting: *Deleted

## 2018-10-15 DIAGNOSIS — Z955 Presence of coronary angioplasty implant and graft: Secondary | ICD-10-CM

## 2018-10-15 DIAGNOSIS — I2102 ST elevation (STEMI) myocardial infarction involving left anterior descending coronary artery: Secondary | ICD-10-CM

## 2018-10-15 NOTE — Progress Notes (Signed)
Cardiac Individual Treatment Plan  Patient Details  Name: Alex Herrera MRN: [id number] Date of Birth: 06/14/1951 Referring Provider:     [place] from 07/25/2018 in [place]  Referring Provider  Larae Grooms MD       Initial Encounter Date:    CARDIAC REHAB [place] from 07/25/2018 in [place]  Date  07/25/18      Visit Diagnosis: ST elevation myocardial infarction involving left anterior descending (LAD) coronary artery [facility])  Status post coronary artery stent placement  Patient's Home Medications on Admission:  Current Outpatient Medications:  .  acetaminophen (TYLENOL) 325 MG tablet, Take 2 tablets (650 mg total) by mouth every 4 (four) hours as needed for headache or mild pain., Disp: , Rfl:  .  aspirin 81 MG chewable tablet, Chew 1 tablet (81 mg total) by mouth daily., Disp: , Rfl:  .  atorvastatin (LIPITOR) 80 MG tablet, Take 80 mg by mouth daily., Disp: , Rfl:  .  Azilsartan Medoxomil (EDARBI) 80 MG TABS, Take 80 mg by mouth daily. , Disp: , Rfl:  .  carvedilol (COREG) 6.25 MG tablet, Take 1 tablet (6.25 mg total) by mouth 2 (two) times daily., Disp: 180 tablet, Rfl: 3 .  Coenzyme Q10 (COQ10 PO), Take 1 capsule by mouth daily., Disp: , Rfl:  .  fluticasone (FLONASE) 50 MCG/ACT nasal spray, Place 2 sprays into both nostrils daily as needed (seasonal allergies). , Disp: , Rfl:  .  gabapentin (NEURONTIN) 300 MG capsule, Take 300 mg by mouth at bedtime., Disp: , Rfl:  .  GLUCOSAMINE-CHONDROITIN PO, Take 2 tablets by mouth daily., Disp: , Rfl:  .  mometasone (ELOCON) 0.1 % cream, Apply 1 application topically daily as needed (wound care)., Disp: , Rfl:  .  prasugrel (EFFIENT) 10 MG TABS tablet, Take 60 mg on first dose tomorrow 06/29/18. Beginning on 06/30/18 start taking 1 tablet daily, Disp: 100 tablet, Rfl: 3 .  Saw Palmetto, Serenoa repens, (SAW PALMETTO PO), Take 2  capsules by mouth daily., Disp: , Rfl:  .  valACYclovir (VALTREX) 1000 MG tablet, Take 1,000 mg by mouth daily. , Disp: , Rfl:   Past Medical History: Past Medical History:  Diagnosis Date  . Acute CHF (congestive heart failure) ([place])    a. presumed diastolic LVEDP 39 during cath 04/2018.  [place] Kitchen Allergic rhinitis   . Arthritis   . Coronary artery disease    a. PCI to RCA 2015. b. STEMI 04/2018 s/p DESx2 to LAD, EF 55-60%.  . Hearing loss   . History of kidney stones   . Hyperlipidemia   . Hypertension   . Skin cancer, basal cell 2000    Tobacco Use: Social History   Tobacco Use  Smoking Status Never Smoker  Smokeless Tobacco Never Used    Labs: Recent Review Scientist, physiological    Labs for ITP Cardiac and Pulmonary Rehab Latest Ref Rng & Units 04/26/2018   Cholestrol 0 - 200 mg/dL 146   LDLCALC 0 - 99 mg/dL 71   HDL >40 mg/dL 57   Trlycerides <150 mg/dL 92      Capillary Blood Glucose: No results found for: GLUCAP   Exercise Target Goals: Exercise Program Goal: Individual exercise prescription set using results from initial 6 min walk test and THRR while considering  patient's activity barriers and safety.   Exercise Prescription Goal: Initial exercise prescription builds to 30-45 minutes a day of aerobic activity,  2-3 days per week.  Home exercise guidelines will be given to patient during program as part of exercise prescription that the participant will acknowledge.  Activity Barriers & Risk Stratification:   6 Minute Walk: 6 Minute Walk    Row Name 10/04/18 1015         6 Minute Walk   Phase  Discharge     Distance  2318 feet     Distance % Change  15.9 %     Walk Time  6 minutes     # of Rest Breaks  0     MPH  4.39     METS  4.55     RPE  13     Perceived Dyspnea   0     VO2 Peak  15.94     Symptoms  No     Resting HR  61 bpm     Resting BP  114/58     Resting Oxygen Saturation   96 %     Exercise Oxygen Saturation  during 6 min walk  96 %     Max  Ex. HR  92 bpm     Max Ex. BP  130/68     2 Minute Post BP  110/60        Oxygen Initial Assessment:   Oxygen Re-Evaluation:   Oxygen Discharge (Final Oxygen Re-Evaluation):   Initial Exercise Prescription:   Perform Capillary Blood Glucose checks as needed.  Exercise Prescription Changes: Exercise Prescription Changes    Row Name 08/19/18 [phone] 09/02/18 0954 09/16/18 0955 09/30/18 0958 10/04/18 0955     Response to Exercise   Blood Pressure (Admit)  120/74  118/82  124/68  108/58  114/58   Blood Pressure (Exercise)  146/82  142/62  176/80  148/68  130/68   Blood Pressure (Exit)  102/62  108/60  108/56  112/64  110/60   Heart Rate (Admit)  87 bpm  69 bpm  69 bpm  66 bpm  61 bpm   Heart Rate (Exercise)  103 bpm  100 bpm  103 bpm  104 bpm  100 bpm   Heart Rate (Exit)  67 bpm  72 bpm  76 bpm  72 bpm  71 bpm   Rating of Perceived Exertion (Exercise)  _0 Symptoms  none  none  none  none  none   Duration  Progress to 30 minutes of  aerobic without signs/symptoms of physical distress  Progress to 30 minutes of  aerobic without signs/symptoms of physical distress  Progress to 30 minutes of  aerobic without signs/symptoms of physical distress  Progress to 30 minutes of  aerobic without signs/symptoms of physical distress  Progress to 30 minutes of  aerobic without signs/symptoms of physical distress   Intensity  THRR unchanged  THRR unchanged  THRR unchanged  THRR unchanged  THRR unchanged     Progression   Progression  Continue to progress workloads to maintain intensity without signs/symptoms of physical distress.  Continue to progress workloads to maintain intensity without signs/symptoms of physical distress.  Continue to progress workloads to maintain intensity without signs/symptoms of physical distress.  Continue to progress workloads to maintain intensity without signs/symptoms of physical distress.  Continue to progress workloads to maintain intensity without  signs/symptoms of physical distress.   Average METs  4.3  4.5  4._1 Resistance Training   Training Prescription  Yes  Yes  Yes  Yes  Yes   Weight  5lbs  5lbs  5lbs  5lbs  5lbs   Reps  10-15  10-15  10-15  10-15  10-15   Time  10 Minutes  10 Minutes  10 Minutes  10 Minutes  10 Minutes     Interval Training   Interval Training  No  No  No  No  No     Treadmill   MPH  3.3  3.4  3.4  3.4  3.4   Grade  3.5  3._0 Minutes  _1 METs  5.12  5.24  5.48  5.95  5.95     Bike   Level  1.5  1.5  1.8  1.8  1.8   Minutes  _2 METs  4.03  4.03  4.7  4.65  4.65     NuStep   Level  _3 -   SPM  85  85  85  85  -   Minutes  _4 -   METs  3.7  4.2  4.4  4.2  -     Rower   Level  -  -  -  -  -   Watts  -  -  -  -  -   Minutes  -  -  -  -  -   METs  -  -  -  -  -     Track   Laps  -  -  -  -  - 2318 feet   Minutes  -  -  -  -  6 Walk test   METs  -  -  -  -  4.36     Home Exercise Plan   Plans to continue exercise at  [employer] (comment) Patient exercising at gym and walking at home.  [employer] (comment) Patient exercising at gym and walking at home.  [employer] (comment) Patient exercising at gym and walking at home.  [employer] (comment) Patient exercising at gym and walking at home.  [employer] (comment) Patient exercising at gym and walking at home.   Frequency  Add 4 additional days to program exercise sessions.  Add 4 additional days to program exercise sessions.  Add 4 additional days to program exercise sessions.  Add 4 additional days to program exercise sessions.  Add 4 additional days to program exercise sessions.   Initial Home Exercises Provided  -  08/07/18  08/07/18  08/07/18  08/07/18      Exercise Comments: Exercise Comments    Row Name 08/19/18 1011 09/02/18 0945 09/16/18 1035 09/30/18 1000     Exercise Comments  Reviewed METs with patient.  Reviewed METs  and goals with patient.  METs reviewed with patient.  Reviewed METs and goals with patient.       Exercise Goals and Review:   Exercise Goals Re-Evaluation : Exercise Goals Re-Evaluation    Row Name 09/02/18 0945 09/30/18 1000 10/04/18 1022 10/09/18 1132       Exercise Goal Re-Evaluation   Exercise Goals Review  Increase Physical Activity;Able to understand and use rate of perceived exertion (RPE) scale;Knowledge and understanding of Target Heart Rate Range (THRR);Understanding of Exercise Prescription  Increase  Physical Activity;Able to understand and use rate of perceived exertion (RPE) scale;Knowledge and understanding of Target Heart Rate Range (THRR);Understanding of Exercise Prescription;Increase Strength and Stamina  Increase Physical Activity;Able to understand and use rate of perceived exertion (RPE) scale;Knowledge and understanding of Target Heart Rate Range (THRR);Understanding of Exercise Prescription;Increase Strength and Stamina  -    Comments  Patient is progressing well with exercise. Patient exercises 2 days/week at the gym: 35mns stretching, 275ms on elliptical, 2070m wt training and 2 days/week walking 70m47m Encouraged patient to increase aerobic exercise at gym from 20mi25mo 30min26mthe gym to help achieve wt loss goal.  Patient states he's doing well with exercise. Pt is exercising at least 3 days/week in addition to exercise at cardiac rehab. Pt's current MET level is 5.0.  Patient's functional capacity increased 16% as measured by 6MWT, strength increased 22% as measured by grip strength test. Pt is currently exercising at a gym 3 days/week and plans to continue that upon completion of the phase 2 cardiac rehab program as well as walking most days of the week.   Temporary department closure due to COVID-19.    Expected Outcomes  Patient will increase exercise duration at the gym to help achieve weight loss goal.  Patient will continue current exercise routine to help  increase stamina and help with weight loss.  Patient will exercise 5-7 days/week walking and at local gym to maintain health and fitness gains.  -       Discharge Exercise Prescription (Final Exercise Prescription Changes): Exercise Prescription Changes - 10/04/18 0955      Response to Exercise   Blood Pressure (Admit)  114/58    Blood Pressure (Exercise)  130/68    Blood Pressure (Exit)  110/60    Heart Rate (Admit)  61 bpm    Heart Rate (Exercise)  100 bpm    Heart Rate (Exit)  71 bpm    Rating of Perceived Exertion (Exercise)  13    Symptoms  none    Duration  Progress to 30 minutes of  aerobic without signs/symptoms of physical distress    Intensity  THRR unchanged      Progression   Progression  Continue to progress workloads to maintain intensity without signs/symptoms of physical distress.    Average METs  5      Resistance Training   Training Prescription  Yes    Weight  5lbs    Reps  10-15    Time  10 Minutes      Interval Training   Interval Training  No      Treadmill   MPH  3.4    Grade  5    Minutes  10    METs  5.95      Bike   Level  1.8    Minutes  10    METs  4.65      NuStep   Level  --    SPM  --    Minutes  --    METs  --      Track   Laps  --   2318 feet   Minutes  6   Walk test   METs  4.36      Home Exercise Plan   [facility]   Patient exercising at gym and walking at home.   Frequency  Add 4 additional days to program exercise sessions.    Initial Home Exercises Provided  08/07/18       Nutrition:  Target Goals: Understanding of nutrition guidelines, daily intake of sodium <1583m, cholesterol <2037m calories 30% from fat and 7% or less from saturated fats, daily to have 5 or more servings of fruits and vegetables.  Biometrics:  Post Biometrics - 10/04/18 1022       Post  Biometrics   Waist Circumference  39 inches    Hip Circumference  41.75 inches    Waist to Hip Ratio   0.93 %    Triceps Skinfold  18.5 mm    Grip Strength  35.5 kg    Flexibility  8 in    Single Leg Stand  30 seconds       Nutrition Therapy Plan and Nutrition Goals:   Nutrition Assessments:   Nutrition Goals Re-Evaluation:   Nutrition Goals Re-Evaluation:   Nutrition Goals Discharge (Final Nutrition Goals Re-Evaluation):   Psychosocial: Target Goals: Acknowledge presence or absence of significant depression and/or stress, maximize coping skills, provide positive support system. Participant is able to verbalize types and ability to use techniques and skills needed for reducing stress and depression.  Initial Review & Psychosocial Screening:   Quality of Life Scores:  Scores of 19 and below usually indicate a poorer quality of life in these areas.  A difference of  2-3 points is a clinically meaningful difference.  A difference of 2-3 points in the total score of the Quality of Life Index has been associated with significant improvement in overall quality of life, self-image, physical symptoms, and general health in studies assessing change in quality of life.  PHQ-9: Recent Review Flowsheet Data    There is no flowsheet data to display.     Interpretation of Total Score  Total Score Depression Severity:  1-4 = Minimal depression, 5-9 = Mild depression, 10-14 = Moderate depression, 15-19 = Moderately severe depression, 20-27 = Severe depression   Psychosocial Evaluation and Intervention:   Psychosocial Re-Evaluation: Psychosocial Re-Evaluation    RoMcClellandame 08/29/18 [id number]/20 1641 10/10/18 1431         Psychosocial Re-Evaluation   Current issues with  None Identified  None Identified  None Identified     Comments  -  -  unable to curently assess as exercise is on hold     Interventions  Encouraged to attend Cardiac Rehabilitation for the exercise  Encouraged to attend Cardiac Rehabilitation for the exercise  -     Continue Psychosocial Services   No Follow up  required  No Follow up required  -        Psychosocial Discharge (Final Psychosocial Re-Evaluation): Psychosocial Re-Evaluation - 10/10/18 1431      Psychosocial Re-Evaluation   Current issues with  None Identified    Comments  unable to curently assess as exercise is on hold       Vocational Rehabilitation: Provide vocational rehab assistance to qualifying candidates.   Vocational Rehab Evaluation & Intervention:   Education: Education Goals: Education classes will be provided on a weekly basis, covering required topics. Participant will state understanding/return demonstration of topics presented.  Learning Barriers/Preferences:   Education Topics: Count Your Pulse:  -Group instruction provided by verbal instruction, demonstration, patient participation and written materials to support subject.  Instructors address importance of being able to find your pulse and how to count your pulse when at home without a heart monitor.  Patients get hands on experience counting their pulse with staff help and individually.   CARDIAC REHAB PHASE  II EXERCISE from 10/04/2018 in [place]  Date  08/16/18  Educator  RN  Instruction Review Code  2- Demonstrated Understanding      Heart Attack, Angina, and Risk Factor Modification:  -Group instruction provided by verbal instruction, video, and written materials to support subject.  Instructors address signs and symptoms of angina and heart attacks.    Also discuss risk factors for heart disease and how to make changes to improve heart health risk factors.   Functional Fitness:  -Group instruction provided by verbal instruction, demonstration, patient participation, and written materials to support subject.  Instructors address safety measures for doing things around the house.  Discuss how to get up and down off the floor, how to pick things up properly, how to safely get out of a chair without assistance, and  balance training.   CARDIAC REHAB PHASE II EXERCISE from 10/04/2018 in [place]  Date  08/09/18  Educator  EP  Instruction Review Code  2- Demonstrated Understanding      Meditation and Mindfulness:  -Group instruction provided by verbal instruction, patient participation, and written materials to support subject.  Instructor addresses importance of mindfulness and meditation practice to help reduce stress and improve awareness.  Instructor also leads participants through a meditation exercise.    CARDIAC REHAB PHASE II EXERCISE from 10/04/2018 in [place]  Date  08/07/18  Instruction Review Code  2- Demonstrated Understanding      Stretching for Flexibility and Mobility:  -Group instruction provided by verbal instruction, patient participation, and written materials to support subject.  Instructors lead participants through series of stretches that are designed to increase flexibility thus improving mobility.  These stretches are additional exercise for major muscle groups that are typically performed during regular warm up and cool down.   Hands Only CPR:  -Group verbal, video, and participation provides a basic overview of AHA guidelines for community CPR. Role-play of emergencies allow participants the opportunity to practice calling for help and chest compression technique with discussion of AED use.   Hypertension: -Group verbal and written instruction that provides a basic overview of hypertension including the most recent diagnostic guidelines, risk factor reduction with self-care instructions and medication management.   CARDIAC REHAB PHASE II EXERCISE from 10/04/2018 in [place]  Date  10/04/18  Educator  RN  Instruction Review Code  2- Demonstrated Understanding       Nutrition I class: Heart Healthy Eating:  -Group instruction provided by PowerPoint slides, verbal  discussion, and written materials to support subject matter. The instructor gives an explanation and review of the Therapeutic Lifestyle Changes diet recommendations, which includes a discussion on lipid goals, dietary fat, sodium, fiber, plant stanol/sterol esters, sugar, and the components of a well-balanced, healthy diet.   Nutrition II class: Lifestyle Skills:  -Group instruction provided by PowerPoint slides, verbal discussion, and written materials to support subject matter. The instructor gives an explanation and review of label reading, grocery shopping for heart health, heart healthy recipe modifications, and ways to make healthier choices when eating out.   Diabetes Question & Answer:  -Group instruction provided by PowerPoint slides, verbal discussion, and written materials to support subject matter. The instructor gives an explanation and review of diabetes co-morbidities, pre- and post-prandial blood glucose goals, pre-exercise blood glucose goals, signs, symptoms, and treatment of hypoglycemia and hyperglycemia, and foot care basics.   Diabetes Blitz:  -Group  instruction provided by PowerPoint slides, verbal discussion, and written materials to support subject matter. The instructor gives an explanation and review of the physiology behind type 1 and type 2 diabetes, diabetes medications and rational behind using different medications, pre- and post-prandial blood glucose recommendations and Hemoglobin A1c goals, diabetes diet, and exercise including blood glucose guidelines for exercising safely.    Portion Distortion:  -Group instruction provided by PowerPoint slides, verbal discussion, written materials, and food models to support subject matter. The instructor gives an explanation of serving size versus portion size, changes in portions sizes over the last 20 years, and what consists of a serving from each food group.   Stress Management:  -Group instruction provided by verbal  instruction, video, and written materials to support subject matter.  Instructors review role of stress in heart disease and how to cope with stress positively.     CARDIAC REHAB PHASE II EXERCISE from 10/04/2018 in [place]  Date  08/21/18  Educator  RN  Instruction Review Code  2- Demonstrated Understanding      Exercising on Your Own:  -Group instruction provided by verbal instruction, power point, and written materials to support subject.  Instructors discuss benefits of exercise, components of exercise, frequency and intensity of exercise, and end points for exercise.  Also discuss use of nitroglycerin and activating EMS.  Review options of places to exercise outside of rehab.  Review guidelines for sex with heart disease.   Cardiac Drugs I:  -Group instruction provided by verbal instruction and written materials to support subject.  Instructor reviews cardiac drug classes: antiplatelets, anticoagulants, beta blockers, and statins.  Instructor discusses reasons, side effects, and lifestyle considerations for each drug class.   CARDIAC REHAB PHASE II EXERCISE from 10/04/2018 in [place]  Date  07/31/18  Instruction Review Code  2- Demonstrated Understanding      Cardiac Drugs II:  -Group instruction provided by verbal instruction and written materials to support subject.  Instructor reviews cardiac drug classes: angiotensin converting enzyme inhibitors (ACE-I), angiotensin II receptor blockers (ARBs), nitrates, and calcium channel blockers.  Instructor discusses reasons, side effects, and lifestyle considerations for each drug class.   CARDIAC REHAB PHASE II EXERCISE from 10/04/2018 in [place]  Date  09/04/18  Instruction Review Code  2- Demonstrated Understanding      Anatomy and Physiology of the Circulatory System:  Group verbal and written instruction and models provide basic  cardiac anatomy and physiology, with the coronary electrical and arterial systems. Review of: AMI, Angina, Valve disease, Heart Failure, Peripheral Artery Disease, Cardiac Arrhythmia, Pacemakers, and the ICD.   CARDIAC REHAB PHASE II EXERCISE from 10/04/2018 in [place]  Date  08/28/18  Educator  RN  Instruction Review Code  2- Demonstrated Understanding      Other Education:  -Group or individual verbal, written, or video instructions that support the educational goals of the cardiac rehab program.   Holiday Eating Survival Tips:  -Group instruction provided by PowerPoint slides, verbal discussion, and written materials to support subject matter. The instructor gives patients tips, tricks, and techniques to help them not only survive but enjoy the holidays despite the onslaught of food that accompanies the holidays.   Knowledge Questionnaire Score:   Core Components/Risk Factors/Patient Goals at Admission:   Core Components/Risk Factors/Patient Goals Review:  Goals and Risk Factor Review    Row Name 08/29/18 0830 09/20/18 1641  10/10/18 1433         Core Components/Risk Factors/Patient Goals Review   Personal Goals Review  Weight Management/Obesity;Heart Failure;Lipids;Hypertension;Stress  Weight Management/Obesity;Heart Failure;Lipids;Hypertension;Stress  Weight Management/Obesity;Heart Failure;Lipids;Hypertension;Stress     Review  Ed is doing well with exercise. Ed has an occasional exertional bp elevation otherwise Ed's vital signs have been stable  Ed is doing well with exercise. Ed has an occasional exertional bp elevation otherwise Ed's vital signs have been stable   Exercise is currently on hold per recommended guidelines from the federal government to prevent the spread of COVID-19     Expected Outcomes  Ed will continue to participate in phase 2 cardiac rehab for exercise , nutrition and lifestyle modifications  Ed will continue to participate  in phase 2 cardiac rehab for exercise , nutrition and lifestyle modifications  Ed will continue to participate in phase 2 cardiac rehab for exercise , nutrition and lifestyle modifications once exercise resumes        Core Components/Risk Factors/Patient Goals at Discharge (Final Review):  Goals and Risk Factor Review - 10/10/18 1433      Core Components/Risk Factors/Patient Goals Review   Personal Goals Review  Weight Management/Obesity;Heart Failure;Lipids;Hypertension;Stress    Review   Exercise is currently on hold per recommended guidelines from the federal government to prevent the spread of COVID-19    Expected Outcomes  Ed will continue to participate in phase 2 cardiac rehab for exercise , nutrition and lifestyle modifications once exercise resumes       ITP Comments: ITP Comments    Row Name 08/29/18 0827 09/20/18 1633 10/10/18 1430       ITP Comments  30  Day ITP Review. Patient with good participation and attendance in phase 2 cardiac rehab  30  Day ITP Review. Patient with good participation and attendance in phase 2 cardiac rehab  30  Day ITP Review.  Exercise is currently on hold per recommended guidelines from the federal government to prevent the spread of COVID-19        Comments: See ITP comments.Barnet Pall, RN,BSN 10/15/2018 10:44 AM

## 2018-10-16 ENCOUNTER — Encounter (HOSPITAL_COMMUNITY): Payer: Medicare Other

## 2018-10-16 ENCOUNTER — Ambulatory Visit (HOSPITAL_COMMUNITY): Payer: Medicare Other

## 2018-10-17 ENCOUNTER — Ambulatory Visit: Payer: Medicare Other | Admitting: Interventional Cardiology

## 2018-10-18 ENCOUNTER — Ambulatory Visit (HOSPITAL_COMMUNITY): Payer: Medicare Other

## 2018-10-18 ENCOUNTER — Encounter (HOSPITAL_COMMUNITY): Payer: Medicare Other

## 2018-10-21 ENCOUNTER — Ambulatory Visit (HOSPITAL_COMMUNITY): Payer: Medicare Other

## 2018-10-21 ENCOUNTER — Encounter (HOSPITAL_COMMUNITY): Payer: Medicare Other

## 2018-10-23 ENCOUNTER — Ambulatory Visit (HOSPITAL_COMMUNITY): Payer: Medicare Other

## 2018-10-23 ENCOUNTER — Encounter (HOSPITAL_COMMUNITY): Payer: Medicare Other

## 2018-10-25 ENCOUNTER — Ambulatory Visit (HOSPITAL_COMMUNITY): Payer: Medicare Other

## 2018-10-25 ENCOUNTER — Encounter (HOSPITAL_COMMUNITY): Payer: Medicare Other

## 2018-10-28 ENCOUNTER — Ambulatory Visit (HOSPITAL_COMMUNITY): Payer: Medicare Other

## 2018-10-28 ENCOUNTER — Encounter (HOSPITAL_COMMUNITY): Payer: Medicare Other

## 2018-10-30 ENCOUNTER — Ambulatory Visit (HOSPITAL_COMMUNITY): Payer: Medicare Other

## 2018-10-30 ENCOUNTER — Encounter (HOSPITAL_COMMUNITY): Payer: Medicare Other

## 2018-10-30 ENCOUNTER — Telehealth (HOSPITAL_COMMUNITY): Payer: Self-pay | Admitting: *Deleted

## 2018-10-30 NOTE — Telephone Encounter (Signed)
Called to notify patient that the cardiac and pulmonary rehabilitation department will be closed temporarily due to COVID-19 restrictions. Message left on voicemail.   Sol Passer, MS, ACSM CEP 10/30/2018 1023

## 2018-11-01 ENCOUNTER — Ambulatory Visit (HOSPITAL_COMMUNITY): Payer: Medicare Other

## 2018-11-04 ENCOUNTER — Ambulatory Visit (HOSPITAL_COMMUNITY): Payer: Medicare Other

## 2018-11-07 ENCOUNTER — Other Ambulatory Visit: Payer: Self-pay | Admitting: Podiatry

## 2018-11-07 ENCOUNTER — Encounter: Payer: Self-pay | Admitting: Podiatry

## 2018-11-07 ENCOUNTER — Ambulatory Visit (INDEPENDENT_AMBULATORY_CARE_PROVIDER_SITE_OTHER): Payer: Medicare Other

## 2018-11-07 ENCOUNTER — Ambulatory Visit: Payer: Medicare Other | Admitting: Podiatry

## 2018-11-07 ENCOUNTER — Other Ambulatory Visit: Payer: Self-pay

## 2018-11-07 VITALS — Temp 97.5°F

## 2018-11-07 DIAGNOSIS — M205X9 Other deformities of toe(s) (acquired), unspecified foot: Secondary | ICD-10-CM

## 2018-11-07 DIAGNOSIS — M779 Enthesopathy, unspecified: Secondary | ICD-10-CM

## 2018-11-07 DIAGNOSIS — M79672 Pain in left foot: Secondary | ICD-10-CM

## 2018-11-07 DIAGNOSIS — M79671 Pain in right foot: Secondary | ICD-10-CM | POA: Diagnosis not present

## 2018-11-07 DIAGNOSIS — M7751 Other enthesopathy of right foot: Secondary | ICD-10-CM

## 2018-11-07 DIAGNOSIS — G629 Polyneuropathy, unspecified: Secondary | ICD-10-CM

## 2018-11-07 DIAGNOSIS — M205X2 Other deformities of toe(s) (acquired), left foot: Secondary | ICD-10-CM

## 2018-11-07 MED ORDER — TRIAMCINOLONE ACETONIDE 10 MG/ML IJ SUSP
10.0000 mg | Freq: Once | INTRAMUSCULAR | Status: AC
  Administered 2018-11-07: 10 mg

## 2018-11-07 NOTE — Progress Notes (Signed)
   Subjective:    Patient ID: Alex Herrera, male    DOB: [birth year], 68 y.o.   MRN: [id number]  HPI    Review of Systems  All other systems reviewed and are negative.      Objective:   Physical Exam        Assessment & Plan:

## 2018-11-11 NOTE — Progress Notes (Signed)
Subjective:   Patient ID: Alex Herrera, male   DOB: 68 y.o.   MRN: [id number]   HPI Patient presents stating that he has had trouble with his feet for around 20 years and it is been an ongoing issue and they just gradually gotten worse and it is hard for him to define where they occur but he does seem to have problems around the big toe joints bilateral.  States is been diagnosed with neuropathy and does take medicine for it patient does not smoke likes to be active   Review of Systems  All other systems reviewed and are negative.       Objective:  Physical Exam Vitals signs and nursing note reviewed.  Constitutional:      Appearance: He is well-developed.  Pulmonary:     Effort: Pulmonary effort is normal.  Musculoskeletal: Normal range of motion.  Skin:    General: Skin is warm.  Neurological:     Mental Status: He is alert.     Vascular status intact with diminishment of sharp dull vibratory bilateral.  Patient is noted to have discomfort around the first MPJ bilateral with limited range of motion around the joint and crepitus of a mild nature with movement.  Patient had good digital perfusion well oriented x3     Assessment:  Probable neuropathy-like symptoms with mild to moderate hallux limitus and spur formation with defined changes on x-ray     Plan:  H&P x-rays reviewed and today I did go ahead and carefully injected the first MPJ bilateral 3 mg Kenalog to 5 mg Xylocaine to try to reduce the inflammation pain around the joint surfaces.  Reappoint for [place] to recheck again in the next several months or earlier if needed

## 2018-12-05 ENCOUNTER — Other Ambulatory Visit: Payer: Self-pay

## 2018-12-05 ENCOUNTER — Ambulatory Visit: Payer: Medicare Other | Admitting: Podiatry

## 2018-12-05 ENCOUNTER — Encounter: Payer: Self-pay | Admitting: Podiatry

## 2018-12-05 VITALS — Temp 97.9°F

## 2018-12-05 DIAGNOSIS — M779 Enthesopathy, unspecified: Secondary | ICD-10-CM | POA: Diagnosis not present

## 2018-12-05 DIAGNOSIS — M205X9 Other deformities of toe(s) (acquired), unspecified foot: Secondary | ICD-10-CM

## 2018-12-05 MED ORDER — TRIAMCINOLONE ACETONIDE 10 MG/ML IJ SUSP
10.0000 mg | Freq: Once | INTRAMUSCULAR | Status: AC
  Administered 2018-12-05: 10 mg

## 2018-12-05 NOTE — Progress Notes (Signed)
Subjective:   Patient ID: Alex Herrera, male   DOB: 68 y.o.   MRN: [id number]   HPI Patient states he is improved but is still getting some pain around his big toe joint but it seems that that is the vascular Jorde of where his pain is coming from   ROS      Objective:  Physical Exam  Neurovascular status intact with continued inflammation around the first MPJ of both feet with moderate pain with palpation     Assessment:  Inflammatory capsulitis first MPJ bilateral with moderate hallux limitus deformity     Plan:  H&P conditions reviewed and today I did do sterile injection of 2 mg Kenalog 5 mg Xylocaine around the first MPJ bilateral and will get a wait 4 months and see the results.  I did explain that we can do this periodically but cannot do it routinely and that ultimately surgical intervention in this case may be necessary depending on how long he gets relief for

## 2018-12-17 ENCOUNTER — Telehealth (HOSPITAL_COMMUNITY): Payer: Self-pay | Admitting: *Deleted

## 2018-12-18 ENCOUNTER — Encounter (HOSPITAL_COMMUNITY): Payer: Self-pay

## 2018-12-18 NOTE — Progress Notes (Signed)
Transitioning to virtual cardiac rehab  Dr.  Irish Lack   As you are aware our department remains closed to patients due to Covid-19.  We are excited to be able to offer an alternative to traditional onsite Cardiac Rehab while your patient continues to follow Re-Open guidelines.  This is a notification that your patient has been contacted and is very interested in participating in Virtual Cardiac Rehab.  Thank you for your continued support in helping [place] meet the health care needs of our patients.  Tedra Senegal. Support Rep II   Cardiac Rehab staff

## 2018-12-19 ENCOUNTER — Telehealth: Payer: Self-pay

## 2018-12-19 NOTE — Telephone Encounter (Signed)

## 2018-12-22 NOTE — Progress Notes (Signed)
Virtual Visit via Video Note   This visit type was conducted due to national recommendations for restrictions regarding the COVID-19 Pandemic (e.g. social distancing) in an effort to limit this patient's exposure and mitigate transmission in our community.  Due to his co-morbid illnesses, this patient is at least at moderate risk for complications without adequate follow up.  This format is felt to be most appropriate for this patient at this time.  All issues noted in this document were discussed and addressed.  A limited physical exam was performed with this format.  Please refer to the patient's chart for his consent to telehealth for [facility].   Date:  12/23/2018   ID:  Alex Herrera, DOB 04-30-1951, MRN [id number]  Patient Location: Home Provider Location: Home  PCP:  Maury Dus, MD  Cardiologist:  Larae Grooms, MD  Electrophysiologist:  None   Evaluation Performed:  Follow-Up Visit  Chief Complaint:  CAD  History of Present Illness:    Alex Herrera is a 68 y.o. male who was previously seen by Dr. Wynonia Lawman.  He has a history of CAD (remote PCI to RCA 2015, STEMI 04/2018 s/p DES to LAD x 2 with acute diastolic CHF), HTN, HLD who presents for f/u of CAD. He was previously followed by Dr. Wynonia Lawman. The patient presented to [facility] 04/26/18 with chest pain. Initial EKG nonacute, but repeat EKG with recurrent chest pain noted dramatic ST elevation and code STEMI was called. He unerwent urgent cath showing 99% pLAD and an 80% mLAD. The previously placed RCA was patent and there was no signifcant CFX or LM disease noted. He underwent PCI with DES x 2. His LVEDP was elevated and he was treated with IV Lasix x 1. Troponin peaked at 20. Echo 04/27/18 showed preserved LVF with an EF of 55-60% but hypokinesis of mid anteroseptal myocardium; normal diastolic parameters. He was on Livalo and Plavix prior to DC, sent home on Atorvastatin and Brilinta. 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 He has history of myalgias with Crestor. At f/u 05/13/18 BP was elevated so metoprolol was switched to carvedilol. Followup labs otherwise showed normal CBC, K 3.9, Cr 1.12, LDL 71, no recent LFTs on file.   Cath in 10/19 showed:  Previously placed Prox RCA stent (unknown type) is widely patent.  Prox LAD to Mid LAD lesion is 80% stenosed.  A drug-eluting stent was successfully placed using a STENT SYNERGY DES 3X38.  Post intervention, there is a 0% residual stenosis.  Ost LAD to Prox LAD lesion is 99% stenosed.  A drug-eluting stent was successfully placed using a STENT SYNERGY DES 3.5X8.  Post intervention, there is a 0% residual stenosis.  LV end diastolic pressure is severely elevated. 39 mm Hg.  There is no aortic valve stenosis.    Recommend uninterrupted dual antiplatelet therapy with Aspirin 81mg  daily and Ticagrelor 90mg  twice daily for a minimum of 12 months (ACS - Class I recommendation).    BP has been in the 130/80 range at home.  Doing well.  Denies : Chest pain. Dizziness. Leg edema. Nitroglycerin use. Orthopnea. Palpitations. Paroxysmal nocturnal dyspnea. Shortness of breath. Syncope.   The patient does not have symptoms concerning for COVID-19 infection (fever, chills, cough, or new shortness of breath).    Past Medical History:  Diagnosis Date  . Acute CHF (congestive heart failure) ([place])    a. presumed diastolic LVEDP 39 during cath 04/2018.  Marland Kitchen Allergic rhinitis   . Arthritis   .  Coronary artery disease    a. PCI to RCA 2015. b. STEMI 04/2018 s/p DESx2 to LAD, EF 55-60%.  . Hearing loss   . History of kidney stones   . Hyperlipidemia   . Hypertension   . Skin cancer, basal cell 2000   Past Surgical History:  Procedure Laterality Date  . CARDIAC CATHETERIZATION    . COLONOSCOPY    . CORONARY STENT INTERVENTION N/A 04/26/2018   Procedure: CORONARY STENT INTERVENTION;  Surgeon: Jettie Booze, MD;  Location: [facility];   Service: Cardiovascular;  Laterality: N/A;  . LEFT HEART CATH AND CORONARY ANGIOGRAPHY N/A 04/26/2018   Procedure: LEFT HEART CATH AND CORONARY ANGIOGRAPHY;  Surgeon: Jettie Booze, MD;  Location: [facility];  Service: Cardiovascular;  Laterality: N/A;  . LEFT HEART CATHETERIZATION WITH CORONARY ANGIOGRAM N/A 01/22/2014   Procedure: LEFT HEART CATHETERIZATION WITH CORONARY ANGIOGRAM;  Surgeon: Jacolyn Reedy, MD;  Location: [facility] CATH LAB;  Service: Cardiovascular;  Laterality: N/A;  . NASAL SEPTUM SURGERY     1970's  . PERCUTANEOUS CORONARY STENT INTERVENTION (PCI-S)  01/22/2014   Procedure: PERCUTANEOUS CORONARY STENT INTERVENTION (PCI-S);  Surgeon: Jacolyn Reedy, MD;  Location: [facility] CATH LAB;  Service: Cardiovascular;;  . PTCA  01/22/2014   PTCA/DES 1 PROXIMAL RCA           DR TILLEY  . VASECTOMY       Current Meds  Medication Sig  . acetaminophen (TYLENOL) 325 MG tablet Take 2 tablets (650 mg total) by mouth every 4 (four) hours as needed for headache or mild pain.  Marland Kitchen aspirin 81 MG chewable tablet Chew 1 tablet (81 mg total) by mouth daily.  Marland Kitchen atorvastatin (LIPITOR) 80 MG tablet Take 80 mg by mouth daily.  . Azilsartan Medoxomil (EDARBI) 80 MG TABS Take 80 mg by mouth daily.   . carvedilol (COREG) 6.25 MG tablet Take 1 tablet (6.25 mg total) by mouth 2 (two) times daily.  . Coenzyme Q10 (COQ10 PO) Take 1 capsule by mouth daily.  . fluticasone (FLONASE) 50 MCG/ACT nasal spray Place 2 sprays into both nostrils daily as needed (seasonal allergies).   . gabapentin (NEURONTIN) 300 MG capsule Take 300 mg by mouth at bedtime.  Marland Kitchen GLUCOSAMINE-CHONDROITIN PO Take 2 tablets by mouth daily.  . mometasone (ELOCON) 0.1 % cream Apply 1 application topically daily as needed (wound care).  . prasugrel (EFFIENT) 10 MG TABS tablet Take 60 mg on first dose tomorrow 06/29/18. Beginning on 06/30/18 start taking 1 tablet daily  . Saw Palmetto, Serenoa repens, (SAW PALMETTO PO) Take 2 capsules by mouth  daily.  . valACYclovir (VALTREX) 1000 MG tablet Take 1,000 mg by mouth daily.      Allergies:   Other; Penicillins; and Crestor [rosuvastatin calcium]   Social History   Tobacco Use  . Smoking status: Never Smoker  . Smokeless tobacco: Never Used  Substance Use Topics  . Alcohol use: No    Alcohol/week: 0.0 standard drinks    Comment: social  . Drug use: No     Family Hx: The patient's family history includes COPD in his father; Diabetes in his mother; Heart attack in his brother; Hypertension in his father and mother.  ROS:   Please see the history of present illness.    Eating healthier All other systems reviewed and are negative.   Prior CV studies:   The following studies were reviewed today:  Cath results  Labs/Other Tests and Data Reviewed:  EKG:  An ECG dated 11/2018 was personally reviewed today and demonstrated:  NSR, no ST changes  Recent Labs: 06/27/2018: ALT 25; BUN 15; Creatinine, Ser 1.12; Hemoglobin 14.6; NT-Pro BNP 107; Platelets 219; Potassium 4.3; Sodium 141   Recent Lipid Panel Lab Results  Component Value Date/Time   CHOL 146 04/26/2018 01:13 PM   TRIG 92 04/26/2018 01:13 PM   HDL 57 04/26/2018 01:13 PM   CHOLHDL 2.6 04/26/2018 01:13 PM   LDLCALC 71 04/26/2018 01:13 PM    Wt Readings from Last 3 Encounters:  12/23/18 194 lb (88 kg)  07/25/18 205 lb 7.5 oz (93.2 kg)  06/27/18 203 lb 6.4 oz (92.3 kg)     Objective:    Vital Signs:  BP 126/74   Pulse (!) 52   Ht 5' 9.75" (1.772 m)   Wt 194 lb (88 kg)   BMI 28.04 kg/m    VITAL SIGNS:  reviewed GEN:  no acute distress RESPIRATORY:  normal respiratory effort, symmetric expansion PSYCH:  normal affect exam limited by video format  ASSESSMENT & PLAN:    1. CAD: DAPT with Effinet and aspirin.  COntinue aggressive secondary prevention.   2. Hypertensive heart disease: The current medical regimen is effective;  continue present plan and medications. 3. Hyperlipidemia: LDL 37 in  10/2018. HDL 45 4. Ischemic cardiomyopathy: No sign of CHF.  No leg swelling.   COVID-19 Education: The signs and symptoms of COVID-19 were discussed with the patient and how to seek care for testing (follow up with PCP or arrange E-visit).  The importance of social distancing was discussed today.  Time:   Today, I have spent 20 minutes with the patient with telehealth technology discussing the above problems.     Medication Adjustments/Labs and Tests Ordered: Current medicines are reviewed at length with the patient today.  Concerns regarding medicines are outlined above.   Tests Ordered: No orders of the defined types were placed in this encounter.   Medication Changes: No orders of the defined types were placed in this encounter.   Disposition:  Follow up in 1 year(s)  Signed, Larae Grooms, MD  12/23/2018 10:05 AM    [facility]

## 2018-12-23 ENCOUNTER — Telehealth (HOSPITAL_COMMUNITY): Payer: Self-pay

## 2018-12-23 ENCOUNTER — Encounter: Payer: Self-pay | Admitting: Interventional Cardiology

## 2018-12-23 ENCOUNTER — Other Ambulatory Visit: Payer: Self-pay

## 2018-12-23 ENCOUNTER — Telehealth (INDEPENDENT_AMBULATORY_CARE_PROVIDER_SITE_OTHER): Payer: Medicare Other | Admitting: Interventional Cardiology

## 2018-12-23 VITALS — BP 126/74 | HR 52 | Ht 69.75 in | Wt 194.0 lb

## 2018-12-23 DIAGNOSIS — I2510 Atherosclerotic heart disease of native coronary artery without angina pectoris: Secondary | ICD-10-CM

## 2018-12-23 DIAGNOSIS — I10 Essential (primary) hypertension: Secondary | ICD-10-CM

## 2018-12-23 DIAGNOSIS — E785 Hyperlipidemia, unspecified: Secondary | ICD-10-CM | POA: Diagnosis not present

## 2018-12-23 DIAGNOSIS — I255 Ischemic cardiomyopathy: Secondary | ICD-10-CM

## 2018-12-23 MED ORDER — NITROGLYCERIN 0.4 MG SL SUBL: 0.4000 mg | SUBLINGUAL_TABLET | SUBLINGUAL | 3 refills | Status: DC | PRN

## 2018-12-23 MED ORDER — ATORVASTATIN CALCIUM 80 MG PO TABS: 80.0000 mg | ORAL_TABLET | Freq: Every day | ORAL | 3 refills | Status: DC

## 2018-12-23 MED ORDER — CARVEDILOL 6.25 MG PO TABS: 6.2500 mg | ORAL_TABLET | Freq: Two times a day (BID) | ORAL | 3 refills | Status: DC

## 2018-12-23 NOTE — Patient Instructions (Signed)
Medication Instructions:  Your physician has recommended you make the following change in your medication:   A prescription was sent in for Sublingual Nitroglycerin 0.4 mg tablet for you to take AS NEEDED for chest pain. Please use as directed.   Lab work: None Ordered  If you have labs (blood work) drawn today and your tests are completely normal, you will receive your results only by: Marland Kitchen MyChart Message (if you have MyChart) OR . A paper copy in the mail If you have any lab test that is abnormal or we need to change your treatment, we will call you to review the results.  Testing/Procedures: None ordered  Follow-Up: At [facility], you and your health needs are our priority.  As part of our continuing mission to provide you with exceptional heart care, we have created designated Provider Care Teams.  These Care Teams include your primary Cardiologist (physician) and Advanced Practice Providers (APPs -  Physician Assistants and Nurse Practitioners) who all work together to provide you with the care you need, when you need it. . You will need a follow up appointment in 1 year.  Please call our office 2 months in advance to schedule this appointment.  You may see Casandra Doffing, MD or one of the following Advanced Practice Providers on your designated Care Team:   . Lyda Jester, PA-C . Dayna Dunn, PA-C . Ermalinda Barrios, PA-C  Any Other Special Instructions Will Be Listed Below (If Applicable).  Nitroglycerin sublingual tablets What is this medicine? NITROGLYCERIN (nye troe GLI ser in) is a type of vasodilator. It relaxes blood vessels, increasing the blood and oxygen supply to your heart. This medicine is used to relieve chest pain caused by angina. It is also used to prevent chest pain before activities like climbing stairs, going outdoors in cold weather, or sexual activity. This medicine may be used for other purposes; ask your health care provider or pharmacist if you have  questions. COMMON BRAND NAME(S): Nitroquick, Nitrostat, Nitrotab What should I tell my health care provider before I take this medicine? They need to know if you have any of these conditions: -anemia -head injury, recent stroke, or bleeding in the brain -liver disease -previous heart attack -an unusual or allergic reaction to nitroglycerin, other medicines, foods, dyes, or preservatives -pregnant or trying to get pregnant -breast-feeding How should I use this medicine? Take this medicine by mouth as needed. At the first sign of an angina attack (chest pain or tightness) place one tablet under your tongue. You can also take this medicine 5 to 10 minutes before an event likely to produce chest pain. Follow the directions on the prescription label. Let the tablet dissolve under the tongue. Do not swallow whole. Replace the dose if you accidentally swallow it. It will help if your mouth is not dry. Saliva around the tablet will help it to dissolve more quickly. Do not eat or drink, smoke or chew tobacco while a tablet is dissolving. If you are not better within 5 minutes after taking ONE dose of nitroglycerin, call 9-1-1 immediately to seek emergency medical care. Do not take more than 3 nitroglycerin tablets over 15 minutes. If you take this medicine often to relieve symptoms of angina, your doctor or health care professional may provide you with different instructions to manage your symptoms. If symptoms do not go away after following these instructions, it is important to call 9-1-1 immediately. Do not take more than 3 nitroglycerin tablets over 15 minutes. Talk to  your pediatrician regarding the use of this medicine in children. Special care may be needed. Overdosage: If you think you have taken too much of this medicine contact a poison control center or emergency room at once. NOTE: This medicine is only for you. Do not share this medicine with others. What if I miss a dose? This does not apply.  This medicine is only used as needed. What may interact with this medicine? Do not take this medicine with any of the following medications: -certain migraine medicines like ergotamine and dihydroergotamine (DHE) -medicines used to treat erectile dysfunction like sildenafil, tadalafil, and vardenafil -riociguat This medicine may also interact with the following medications: -alteplase -aspirin -heparin -medicines for high blood pressure -medicines for mental depression -other medicines used to treat angina -phenothiazines like chlorpromazine, mesoridazine, prochlorperazine, thioridazine This list may not describe all possible interactions. Give your health care provider a list of all the medicines, herbs, non-prescription drugs, or dietary supplements you use. Also tell them if you smoke, drink alcohol, or use illegal drugs. Some items may interact with your medicine. What should I watch for while using this medicine? Tell your doctor or health care professional if you feel your medicine is no longer working. Keep this medicine with you at all times. Sit or lie down when you take your medicine to prevent falling if you feel dizzy or faint after using it. Try to remain calm. This will help you to feel better faster. If you feel dizzy, take several deep breaths and lie down with your feet propped up, or bend forward with your head resting between your knees. You may get drowsy or dizzy. Do not drive, use machinery, or do anything that needs mental alertness until you know how this drug affects you. Do not stand or sit up quickly, especially if you are an older patient. This reduces the risk of dizzy or fainting spells. Alcohol can make you more drowsy and dizzy. Avoid alcoholic drinks. Do not treat yourself for coughs, colds, or pain while you are taking this medicine without asking your doctor or health care professional for advice. Some ingredients may increase your blood pressure. What side  effects may I notice from receiving this medicine? Side effects that you should report to your doctor or health care professional as soon as possible: -blurred vision -dry mouth -skin rash -sweating -the feeling of extreme pressure in the head -unusually weak or tired Side effects that usually do not require medical attention (report to your doctor or health care professional if they continue or are bothersome): -flushing of the face or neck -headache -irregular heartbeat, palpitations -nausea, vomiting This list may not describe all possible side effects. Call your doctor for medical advice about side effects. You may report side effects to FDA at 1-800-FDA-1088. Where should I keep my medicine? Keep out of the reach of children. Store at room temperature between 20 and 25 degrees C (68 and 77 degrees F). Store in Chief of Staff. Protect from light and moisture. Keep tightly closed. Throw away any unused medicine after the expiration date. NOTE: This sheet is a summary. It may not cover all possible information. If you have questions about this medicine, talk to your doctor, pharmacist, or health care provider.  2019 Elsevier/Gold Standard (2013-05-08 17:57:36)

## 2018-12-23 NOTE — Telephone Encounter (Signed)
° °        Confirm Consent - In the setting of the current Covid19 crisis, you are scheduled for a phone visit with your Cardiac or Pulmonary team member.  Just as we do with many in-gym visits, in order for you to participate in this visit, we must obtain consent.  If you'd like, I can send this to your mychart (if signed up) or email for you to review.  Otherwise, I can obtain your verbal consent now.  By agreeing to a telephone visit, we'd like you to understand that the technology does not allow for your Cardiac or Pulmonary Rehab team member to perform a physical assessment, and thus may limit their ability to fully assess your ability to perform exercise programs. If your provider identifies any concerns that need to be evaluated in person, we will make arrangements to do so.  Finally, though the technology is pretty good, we cannot assure that it will always work on either your or our end and we cannot ensure that we have a secure connection.  Cardiac and Pulmonary Rehab Telehealth visits and At Home cardiac and pulmonary rehab are provided at no cost to you.               Are you willing to proceed?" STAFF: Did the patient verbally acknowledge consent to telehealth visit? Document YES/NO here: Yes      Jessica C.   Cardiac and Pulmonary Rehab Staff   D6/07/2018 T11:44AM

## 2018-12-24 ENCOUNTER — Encounter (HOSPITAL_COMMUNITY): Payer: Self-pay | Admitting: *Deleted

## 2018-12-24 ENCOUNTER — Encounter (HOSPITAL_COMMUNITY)
Admission: RE | Admit: 2018-12-24 | Discharge: 2018-12-24 | Disposition: A | Discharge status: Home / Self Care | Payer: Medicare Other | Source: Ambulatory Visit | Attending: Interventional Cardiology | Admitting: Interventional Cardiology

## 2018-12-24 DIAGNOSIS — Z955 Presence of coronary angioplasty implant and graft: Secondary | ICD-10-CM | POA: Insufficient documentation

## 2018-12-24 DIAGNOSIS — I2102 ST elevation (STEMI) myocardial infarction involving left anterior descending coronary artery: Secondary | ICD-10-CM | POA: Insufficient documentation

## 2018-12-24 NOTE — Progress Notes (Signed)
Called and spoke to pt regarding Virtual Cardiac Rehab.  Pt  was able to download the [employer] app on their smart device with no issues. Pt set up their account and received the following welcome message -"Welcome to the [facility] program. We hope that you will find the exercise program beneficial in your recovery process. Our staff is available to assist with in questions/concerns about your exercise routine. Best wishes". Brief orientation provided to with the advisement to watch the "Intro to Rehab" series located under the Resource tab. Pt verbalized understanding. Will continue to follow and monitor pt progress with feedback as needed. 

## 2019-01-10 ENCOUNTER — Telehealth (HOSPITAL_COMMUNITY): Payer: Self-pay

## 2019-01-10 NOTE — Telephone Encounter (Signed)
Phone call to Pt to inquire about logging his exercise in the virtual CR app. Pt stated he has been taking care of his wife that had some medical issues but would begin to log his exercise in the app. Pt denied any questions regarding the app.

## 2019-01-15 ENCOUNTER — Telehealth (HOSPITAL_COMMUNITY): Payer: Self-pay | Admitting: *Deleted

## 2019-01-20 ENCOUNTER — Telehealth (HOSPITAL_COMMUNITY): Payer: Self-pay | Admitting: *Deleted

## 2019-01-20 NOTE — Telephone Encounter (Signed)
-----   Message from Jettie Booze, MD sent at 01/20/2019  9:03 AM EDT ----- Regarding: RE: Resuming cardiac rehab OK for in person cardiac rehab. JV ----- Message ----- From: Magda Kiel, RN Sent: 01/20/2019   8:49 AM EDT To: Jettie Booze, MD Subject: Resuming cardiac rehab                           Dr. Irish Lack  We are preparing for current patients to phase in reentry to in facility cardiac rehab. A great deal of planning with advisement from our Medical Director - Dr. Radford Pax, CV Service line leadership, Infection Disease Control, Facilities, security,recommendations from American Association of Cardiac and Pulmonary Rehab (AACVPR) with the goal for optimal patient safety. Patients will have strict guidelines and criteria they must adhere and follow.  Pt will have to complete screenings prior to their scheduled appointment and  again prior to entry into gym area.  Your pt, Alex Herrera  expressed great interest in returning to in facility cardiac rehab. Pt has a Covid risk score  6 .  Based upon today's assessment, do you feel this pt is appropriate to resume in facility cardiac rehab?  Any additional restrictions you feel are appropriate for this pt? [name] only has 6 sessions to complete.   Thank you and we appreciate your valued input.  Sincerely,  Barnet Pall RN Cardiac Rehab

## 2019-01-21 ENCOUNTER — Telehealth (HOSPITAL_COMMUNITY): Payer: Self-pay

## 2019-01-21 NOTE — Telephone Encounter (Signed)
Called and spoke with pt in regards to rescheduling for CR, pt will come in on 01/22/2019 @ 930am, to pick up CR.

## 2019-01-22 ENCOUNTER — Other Ambulatory Visit: Payer: Self-pay

## 2019-01-22 ENCOUNTER — Encounter (HOSPITAL_COMMUNITY)
Admission: RE | Admit: 2019-01-22 | Discharge: 2019-01-22 | Disposition: A | Discharge status: Home / Self Care | Payer: Medicare Other | Source: Ambulatory Visit | Attending: Interventional Cardiology | Admitting: Interventional Cardiology

## 2019-01-22 DIAGNOSIS — Z955 Presence of coronary angioplasty implant and graft: Secondary | ICD-10-CM | POA: Diagnosis present

## 2019-01-22 DIAGNOSIS — I2102 ST elevation (STEMI) myocardial infarction involving left anterior descending coronary artery: Secondary | ICD-10-CM | POA: Diagnosis not present

## 2019-01-22 NOTE — Progress Notes (Signed)
Daily Session Note  Patient Details  Name: Alex Herrera MRN: [id number] Date of Birth: 1950-08-18 Referring Provider:     [place] from 07/25/2018 in [place]  Referring Provider  Larae Grooms MD       Encounter Date: 01/22/2019  Check In: Session Check In - 01/22/19 0925      Check-In   Supervising physician immediately available to respond to emergencies  [facility] immediately available    Physician(s)  Dr. Berle Mull    Location  [facility]    Staff Present  Deitra Mayo, BS, ACSM CEP, Exercise Physiologist;Lauralei Clouse, RN, Mosie Epstein, MS,ACSM CEP, Exercise Physiologist;Joan Leonia Reeves, RN, BSN    Virtual Visit  No    Medication changes reported      No    Fall or balance concerns reported     No    Tobacco Cessation  No Change    Warm-up and Cool-down  Performed on first and last piece of equipment    Resistance Training Performed  Yes    VAD Patient?  No    PAD/SET Patient?  No      Pain Assessment   Currently in Pain?  No/denies    Pain Score  0-No pain    Multiple Pain Sites  No       Capillary Blood Glucose: No results found for this or any previous visit (from the past 24 hour(s)).    Social History   Tobacco Use  Smoking Status Never Smoker  Smokeless Tobacco Never Used    Goals Met:  Exercise tolerated well  Goals Unmet:  Not Applicable  Comments: Patient returned to cardiac rehab after department closure due to COVID 19. Social distancing and safety measures are in place. Patient tolerated his first day of exercise without difficulty. VSS. Telemetry: Sinus Rhythm. Moderate exertional blood pressure elevations noted.Resting blood pressures within normal limits. Medications reviewed.Will continue to monitor the patient throughout  the program.Alex Prehn Venetia Maxon, RN,BSN 01/22/2019 11:23 AM    Dr. Fransico Him is Medical Director for Cardiac Rehab at [facility]

## 2019-01-27 ENCOUNTER — Encounter (HOSPITAL_COMMUNITY)
Admission: RE | Admit: 2019-01-27 | Discharge: 2019-01-27 | Disposition: A | Discharge status: Home / Self Care | Payer: Medicare Other | Source: Ambulatory Visit | Attending: Interventional Cardiology | Admitting: Interventional Cardiology

## 2019-01-27 ENCOUNTER — Other Ambulatory Visit: Payer: Self-pay

## 2019-01-27 ENCOUNTER — Encounter (HOSPITAL_COMMUNITY): Payer: Medicare Other

## 2019-01-27 DIAGNOSIS — I2102 ST elevation (STEMI) myocardial infarction involving left anterior descending coronary artery: Secondary | ICD-10-CM

## 2019-01-27 DIAGNOSIS — Z955 Presence of coronary angioplasty implant and graft: Secondary | ICD-10-CM

## 2019-01-29 ENCOUNTER — Encounter (HOSPITAL_COMMUNITY): Payer: Medicare Other

## 2019-01-29 ENCOUNTER — Other Ambulatory Visit: Payer: Self-pay

## 2019-01-29 ENCOUNTER — Encounter (HOSPITAL_COMMUNITY)
Admission: RE | Admit: 2019-01-29 | Discharge: 2019-01-29 | Disposition: A | Discharge status: Home / Self Care | Payer: Medicare Other | Source: Ambulatory Visit | Attending: Interventional Cardiology | Admitting: Interventional Cardiology

## 2019-01-29 DIAGNOSIS — Z955 Presence of coronary angioplasty implant and graft: Secondary | ICD-10-CM

## 2019-01-29 DIAGNOSIS — I2102 ST elevation (STEMI) myocardial infarction involving left anterior descending coronary artery: Secondary | ICD-10-CM

## 2019-01-31 ENCOUNTER — Encounter (HOSPITAL_COMMUNITY): Payer: Medicare Other

## 2019-01-31 ENCOUNTER — Encounter (HOSPITAL_COMMUNITY)
Admission: RE | Admit: 2019-01-31 | Discharge: 2019-01-31 | Disposition: A | Discharge status: Home / Self Care | Payer: Medicare Other | Source: Ambulatory Visit | Attending: Interventional Cardiology | Admitting: Interventional Cardiology

## 2019-01-31 ENCOUNTER — Other Ambulatory Visit: Payer: Self-pay

## 2019-01-31 DIAGNOSIS — Z955 Presence of coronary angioplasty implant and graft: Secondary | ICD-10-CM

## 2019-01-31 DIAGNOSIS — I2102 ST elevation (STEMI) myocardial infarction involving left anterior descending coronary artery: Secondary | ICD-10-CM | POA: Diagnosis not present

## 2019-02-03 ENCOUNTER — Encounter (HOSPITAL_COMMUNITY): Payer: Medicare Other

## 2019-02-03 ENCOUNTER — Other Ambulatory Visit: Payer: Self-pay

## 2019-02-03 ENCOUNTER — Encounter (HOSPITAL_COMMUNITY)
Admission: RE | Admit: 2019-02-03 | Discharge: 2019-02-03 | Disposition: A | Discharge status: Home / Self Care | Payer: Medicare Other | Source: Ambulatory Visit | Attending: Interventional Cardiology | Admitting: Interventional Cardiology

## 2019-02-03 DIAGNOSIS — I2102 ST elevation (STEMI) myocardial infarction involving left anterior descending coronary artery: Secondary | ICD-10-CM | POA: Diagnosis not present

## 2019-02-03 DIAGNOSIS — Z955 Presence of coronary angioplasty implant and graft: Secondary | ICD-10-CM

## 2019-02-05 ENCOUNTER — Encounter (HOSPITAL_COMMUNITY): Payer: Medicare Other

## 2019-02-05 ENCOUNTER — Encounter (HOSPITAL_COMMUNITY)
Admission: RE | Admit: 2019-02-05 | Discharge: 2019-02-05 | Disposition: A | Discharge status: Home / Self Care | Payer: Medicare Other | Source: Ambulatory Visit | Attending: Interventional Cardiology | Admitting: Interventional Cardiology

## 2019-02-05 ENCOUNTER — Other Ambulatory Visit: Payer: Self-pay

## 2019-02-05 VITALS — BP 112/70 | HR 77 | Ht 69.75 in | Wt 198.9 lb

## 2019-02-05 DIAGNOSIS — I2102 ST elevation (STEMI) myocardial infarction involving left anterior descending coronary artery: Secondary | ICD-10-CM | POA: Diagnosis not present

## 2019-02-05 DIAGNOSIS — Z955 Presence of coronary angioplasty implant and graft: Secondary | ICD-10-CM

## 2019-02-05 NOTE — Progress Notes (Signed)
Discharge Progress Report  Patient Details  Name: Alex Herrera MRN: [id number] Date of Birth: May 29, 1951 Referring Provider:     [place] from 07/25/2018 in [place]  Referring Provider  Alex Grooms MD        Number of Visits: 36  Reason for Discharge:  Patient reached a stable level of exercise. Patient independent in their exercise. Patient has met program and personal goals.  Smoking History:  Social History   Tobacco Use  Smoking Status Never Smoker  Smokeless Tobacco Never Used    Diagnosis: S/P Stemi, DES  ADL UCSD:   Initial Exercise Prescription:   Discharge Exercise Prescription (Final Exercise Prescription Changes): Exercise Prescription Changes - 02/05/19 1000      Response to Exercise   Blood Pressure (Admit)  112/70    Blood Pressure (Exercise)  138/82    Blood Pressure (Exit)  108/62    Heart Rate (Admit)  77 bpm    Heart Rate (Exercise)  114 bpm    Heart Rate (Exit)  68 bpm    Rating of Perceived Exertion (Exercise)  13    Symptoms  none    Comments  Pt graduated from cardiac rehab today.    Duration  Progress to 30 minutes of  aerobic without signs/symptoms of physical distress    Intensity  THRR unchanged      Progression   Progression  Continue to progress workloads to maintain intensity without signs/symptoms of physical distress.    Average METs  5.8      Resistance Training   Training Prescription  No   Relaxation day, no weights.   Weight  --    Reps  --    Time  --      Interval Training   Interval Training  No      Treadmill   MPH  3.5    Grade  7    Minutes  15    METs  7.06      NuStep   Level  5    SPM  85    Minutes  15    METs  4.6      Home Exercise Plan   Plans to continue exercise at  [employer] (comment)   Gym currently closeed. Pt will continue walking daily.   Frequency  Add 4 additional days to program exercise sessions.    Initial Home Exercises Provided  08/07/18       Functional Capacity: 6 Minute Walk    Row Name 10/04/18 1015         6 Minute Walk   Phase  Discharge     Distance  2318 feet     Distance % Change  15.9 %     Walk Time  6 minutes     # of Rest Breaks  0     MPH  4.39     METS  4.55     RPE  13     Perceived Dyspnea   0     VO2 Peak  15.94     Symptoms  No     Resting HR  61 bpm     Resting BP  114/58     Resting Oxygen Saturation   96 %     Exercise Oxygen Saturation  during 6 min walk  96 %     Max Ex. HR  92 bpm     Max Ex. BP  130/68     2 Minute Post BP  110/60        Psychological, QOL, Others - Outcomes: PHQ 2/9: Depression screen PHQ 2/9 01/22/2019  Decreased Interest 0  Down, Depressed, Hopeless 0  PHQ - 2 Score 0    Quality of Life:   Personal Goals: Goals established at orientation with interventions provided to work toward goal.    Personal Goals Discharge: Goals and Risk Factor Review    Row Name 08/29/18 0830 09/20/18 1641 01/31/19 1340 02/05/19 0935       Core Components/Risk Factors/Patient Goals Review   Personal Goals Review  Weight Management/Obesity;Heart Failure;Lipids;Hypertension;Stress  Weight Management/Obesity;Heart Failure;Lipids;Hypertension;Stress  Weight Management/Obesity;Heart Failure;Lipids;Hypertension;Stress  Weight Management/Obesity;Heart Failure;Lipids;Hypertension;Stress    Review  Alex Herrera is doing well with exercise. Alex Herrera has an occasional exertional bp elevation otherwise Alex Herrera's vital signs have been stable  Alex Herrera is doing well with exercise. Alex Herrera has an occasional exertional bp elevation otherwise Alex Herrera's vital signs have been stable  Alex Herrera returned to exercise last week and has been doing well with exercise since the program resumed. Alex Herrera has lost 3 kg sonce he has been exercising on his own  Alex Herrera graduates today. Alex Herrera plans to contibnue exercising by walking 3 miles a day and doing yard work    Expected Outcomes  Alex Herrera will continue to participate in  phase 2 cardiac rehab for exercise , nutrition and lifestyle modifications  Alex Herrera will continue to participate in phase 2 cardiac rehab for exercise , nutrition and lifestyle modifications  Alex Herrera will continue to participate in phase 2 cardiac rehab for exercise , nutrition and lifestyle modifications once exercise resumes  Alex Herrera will continue to exercise on his own, follow nutrition and lifestyle modifications upon discharge from phase 2 cardiac rehab       Exercise Goals and Review:   Exercise Goals Re-Evaluation: Exercise Goals Re-Evaluation    Row Name 09/02/18 0945 09/30/18 1000 10/04/18 1022 10/09/18 1132 01/22/19 1130     Exercise Goal Re-Evaluation   Exercise Goals Review  Increase Physical Activity;Able to understand and use rate of perceived exertion (RPE) scale;Knowledge and understanding of Target Heart Rate Range (THRR);Understanding of Exercise Prescription  Increase Physical Activity;Able to understand and use rate of perceived exertion (RPE) scale;Knowledge and understanding of Target Heart Rate Range (THRR);Understanding of Exercise Prescription;Increase Strength and Stamina  Increase Physical Activity;Able to understand and use rate of perceived exertion (RPE) scale;Knowledge and understanding of Target Heart Rate Range (THRR);Understanding of Exercise Prescription;Increase Strength and Stamina  -  Increase Physical Activity;Increase Strength and Stamina;Able to understand and use rate of perceived exertion (RPE) scale;Knowledge and understanding of Target Heart Rate Range (THRR);Able to check pulse independently;Understanding of Exercise Prescription   Comments  Patient is progressing well with exercise. Patient exercises 2 days/week at the gym: 55mns stretching, 270ms on elliptical, 2048m wt training and 2 days/week walking 31m68m Encouraged patient to increase aerobic exercise at gym from 20mi28mo 30min26mthe gym to help achieve wt loss goal.  Patient states he's doing well with  exercise. Pt is exercising at least 3 days/week in addition to exercise at cardiac rehab. Pt's current MET level is 5.0.  Patient's functional capacity increased 16% as measured by 6MWT, strength increased 22% as measured by grip strength test. Pt is currently exercising at a gym 3 days/week and plans to continue that upon completion of the phase 2 cardiac rehab program as well as walking most days of the week.   Temporary department closure due to  COVID-19.  Pt first exercise session since dept. closure due to COVID 19. Pt tolerated exercsie well and did not report any symptoms.   Expected Outcomes  Patient will increase exercise duration at the gym to help achieve weight loss goal.  Patient will continue current exercise routine to help increase stamina and help with weight loss.  Patient will exercise 5-7 days/week walking and at local gym to maintain health and fitness gains.  -  Will continue to monitor and progress Pt as tolerated.   Hugo Name 02/05/19 1019             Exercise Goal Re-Evaluation   Exercise Goals Review  Increase Physical Activity;Increase Strength and Stamina;Able to understand and use rate of perceived exertion (RPE) scale;Knowledge and understanding of Target Heart Rate Range (THRR);Able to check pulse independently;Understanding of Exercise Prescription       Comments  Patient completed the phase 2 cardiac rehab today and progressed well, achieving 5.8 METs with exercise. Pt was previously walking and exercising at a local gym, which is closed at this time due to the COVID-19 pandemic. Pt is currently walking 3 miles and daily and will continue this as his mode of exercise. Pt has a set of 5lb and 10lb weights that he uses 2 times per week for his resistance training.       Expected Outcomes  Patient will walk daily to help maintain health and fitness gains.          Nutrition & Weight - Outcomes:  Post Biometrics - 02/05/19 1022       Post  Biometrics   Height  5' 9.75"  (1.772 m)    Weight  198 lb 13.7 oz (90.2 kg)    Waist Circumference  39 inches    Hip Circumference  41.75 inches    Waist to Hip Ratio  0.93 %    BMI (Calculated)  28.73    Triceps Skinfold  18.5 mm    % Body Fat  28.1 %    Grip Strength  35.5 kg    Flexibility  8 in    Single Leg Stand  30 seconds       Nutrition:   Nutrition Discharge:   Education Questionnaire Score:   Goals reviewed with patient; copy given to patient.Pt graduates on 02/05/19  cardiac rehab program today with completion of 36 exercise sessions in Phase II. Pt maintained good attendance and progressed nicely during his participation in rehab as evidenced by increased MET level.   Medication list reconciled. Repeat  PHQ score-0  .  Pt has made significant lifestyle changes and should be commended for his success. Pt feels he has achieved his goals during cardiac rehab.   Pt plans to continue exercise by walking at home and doing yard work.Barnet Pall, RN,BSN 02/05/2019 11:47 AM

## 2019-02-07 ENCOUNTER — Encounter (HOSPITAL_COMMUNITY): Payer: Medicare Other

## 2019-04-03 ENCOUNTER — Ambulatory Visit: Payer: Medicare Other | Admitting: Podiatry

## 2019-04-03 ENCOUNTER — Encounter: Payer: Self-pay | Admitting: Podiatry

## 2019-04-03 ENCOUNTER — Other Ambulatory Visit: Payer: Self-pay

## 2019-04-03 DIAGNOSIS — M205X9 Other deformities of toe(s) (acquired), unspecified foot: Secondary | ICD-10-CM | POA: Diagnosis not present

## 2019-04-03 DIAGNOSIS — G629 Polyneuropathy, unspecified: Secondary | ICD-10-CM

## 2019-04-03 MED ORDER — GABAPENTIN 100 MG PO CAPS: 100.0000 mg | ORAL_CAPSULE | Freq: Three times a day (TID) | ORAL | 3 refills | Status: DC

## 2019-04-04 NOTE — Progress Notes (Signed)
Subjective:   Patient ID: Alex Herrera, male   DOB: 68 y.o.   MRN: [id number]   HPI Patient states he is getting pain again he does not seem to think the injections make a big difference and the pain appears to be more diffuse than just the big toe joint bilateral   ROS      Objective:  Physical Exam  Neurovascular status intact with patient found to have hallux limitus deformity right over left with discomfort still noted in the joint surface but not severe with overall pain of both feet that is moderate in intensity with no apparent organic cause     Assessment:  Appears to be more of an inflammatory condition and at this time appears to be neuropathic in origin with hallux limitus condition still present     Plan:  H&P reviewed conditions and at this point I have recommended increasing gabapentin and we will just go to 100 mg during the day versus 300 and see how he tolerates it even though he may end up requiring 300.  Continue 300 mg at night and can begin 100 mg today in the day and that was written today prescription wise

## 2019-06-04 NOTE — Progress Notes (Signed)
Cardiology Office Note:    Date:  06/04/2019   ID:  Alex Herrera, DOB 02-27-1951, MRN [id number]  PCP:  Maury Dus, MD  Cardiologist:  Larae Grooms, MD  Electrophysiologist:  None   Referring MD: Antony Contras, MD   No chief complaint on file. CAD  History of Present Illness:    Alex Herrera is a 68 y.o. male with a hx of who was previously seen by Dr. Wynonia Lawman.  He has a history ofCAD (remote PCI to RCA 2015, STEMI 04/2018 s/p DES to LAD x 2 with acute diastolic CHF), HTN, HLD who presents for f/u of CAD. He was previously followed by Dr. Wynonia Lawman. The patient [facility] with chest pain. Initial EKG nonacute, but repeat EKG with recurrent chest pain noted dramatic ST elevation and code STEMI was called. He unerwent urgent cath with results below.  His LVEDP was elevated and he was treated with IV Lasix x 1. Troponin peaked at 20. Echo 04/27/18 showed preserved LVF with an EF of 55-60% but hypokinesis of mid anteroseptal myocardium; normal diastolic parameters. He was on Livalo and Plavix prior to DC, sent home on Atorvastatin and Brilinta. 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 He has history of myalgias with Crestor. At f/u 05/13/18 BP was elevated so metoprolol was switched to carvedilol. Followup labs otherwise showed normal CBC, K 3.9, Cr 1.12, LDL 71, no recent LFTs on file.  Cath in 10/19 showed:  Previously placed Prox RCA stent (unknown type) is widely patent.  Prox LAD to Mid LAD lesion is 80% stenosed.  A drug-eluting stent was successfully placed using a STENT SYNERGY DES 3X38.  Post intervention, there is a 0% residual stenosis.  Ost LAD to Prox LAD lesion is 99% stenosed.  A drug-eluting stent was successfully placed using a STENT SYNERGY DES 3.5X8.  Post intervention, there is a 0% residual stenosis.  LV end diastolic pressure is severely elevated. 39 mm Hg.  There is no aortic valve stenosis.  Recommend uninterrupted  dual antiplatelet therapy with Aspirin 81mg  daily and Ticagrelor 90mg  twice dailyfor a minimum of 12 months (ACS - Class I recommendation).  Denies : Chest pain. Dizziness. Leg edema. Nitroglycerin use. Orthopnea. Palpitations. Paroxysmal nocturnal dyspnea. Shortness of breath. Syncope.   He feels body aches. He has been on several statins in the past.  He has tried Crestor, pravastatin.  He is on atorvastatin and feels similar pain.  THe more he works, the more he aches.  He is wondering if there is another option.    He added some HCTZ for increased BP.  Readings improved at home.   Past Medical History:  Diagnosis Date  . Acute CHF (congestive heart failure) ([place])    a. presumed diastolic LVEDP 39 during cath 04/2018.  Marland Kitchen Allergic rhinitis   . Arthritis   . Coronary artery disease    a. PCI to RCA 2015. b. STEMI 04/2018 s/p DESx2 to LAD, EF 55-60%.  . Hearing loss   . History of kidney stones   . Hyperlipidemia   . Hypertension   . Skin cancer, basal cell 2000    Past Surgical History:  Procedure Laterality Date  . CARDIAC CATHETERIZATION    . COLONOSCOPY    . CORONARY STENT INTERVENTION N/A 04/26/2018   Procedure: CORONARY STENT INTERVENTION;  Surgeon: Jettie Booze, MD;  Location: [place] [facility];  Service: Cardiovascular;  Laterality: N/A;  . LEFT HEART CATH AND CORONARY ANGIOGRAPHY N/A 04/26/2018  Procedure: LEFT HEART CATH AND CORONARY ANGIOGRAPHY;  Surgeon: Jettie Booze, MD;  Location: [place] [facility];  Service: Cardiovascular;  Laterality: N/A;  . LEFT HEART CATHETERIZATION WITH CORONARY ANGIOGRAM N/A 01/22/2014   Procedure: LEFT HEART CATHETERIZATION WITH CORONARY ANGIOGRAM;  Surgeon: Jacolyn Reedy, MD;  Location: [facility] CATH LAB;  Service: Cardiovascular;  Laterality: N/A;  . NASAL SEPTUM SURGERY     1970's  . PERCUTANEOUS CORONARY STENT INTERVENTION (PCI-S)  01/22/2014   Procedure: PERCUTANEOUS CORONARY STENT INTERVENTION (PCI-S);  Surgeon: Jacolyn Reedy, MD;  Location: [facility] CATH LAB;  Service: Cardiovascular;;  . PTCA  01/22/2014   PTCA/DES 1 PROXIMAL RCA           DR TILLEY  . VASECTOMY      Current Medications: No outpatient medications have been marked as taking for the 06/06/19 encounter (Appointment) with Jettie Booze, MD.     Allergies:   Other, Penicillins, and Crestor [rosuvastatin calcium]   Social History   Socioeconomic History  . Marital status: Married    Spouse name: Not on file  . Number of children: 2  . Years of education: Not on file  . Highest education level: Not on file  Occupational History  . Occupation: Self Employed  Social Needs  . Financial resource strain: Not on file  . Food insecurity    Worry: Not on file    Inability: Not on file  . Transportation needs    Medical: Not on file    Non-medical: Not on file  Tobacco Use  . Smoking status: Never Smoker  . Smokeless tobacco: Never Used  Substance and Sexual Activity  . Alcohol use: No    Alcohol/week: 0.0 standard drinks    Comment: social  . Drug use: No  . Sexual activity: Not on file  Lifestyle  . Physical activity    Days per week: Not on file    Minutes per session: Not on file  . Stress: Not on file  Relationships  . Social Herbalist on phone: Not on file    Gets together: Not on file    Attends religious service: Not on file    Active member of club or organization: Not on file    Attends meetings of clubs or organizations: Not on file    Relationship status: Not on file  Other Topics Concern  . Not on file  Social History Narrative  . Not on file     Family History: The patient's family history includes COPD in his father; Diabetes in his mother; Heart attack in his brother; Hypertension in his father and mother.  ROS:   Please see the history of present illness.     All other systems reviewed and are negative.  EKGs/Labs/Other Studies Reviewed:    The following studies were reviewed today:  Labs reviewed; LDL well controlled  EKG:  EKG is ordered today.  The ekg ordered today demonstrates Normal ECG  Recent Labs: 06/27/2018: ALT 25; BUN 15; Creatinine, Ser 1.12; Hemoglobin 14.6; NT-Pro BNP 107; Platelets 219; Potassium 4.3; Sodium 141  Recent Lipid Panel    Component Value Date/Time   CHOL 146 04/26/2018 1313   TRIG 92 04/26/2018 1313   HDL 57 04/26/2018 1313   CHOLHDL 2.6 04/26/2018 1313   VLDL 18 04/26/2018 1313   LDLCALC 71 04/26/2018 1313    Physical Exam:    VS:  There were no vitals taken for this visit.  Wt Readings from Last 3 Encounters:  02/05/19 198 lb 13.7 oz (90.2 kg)  12/23/18 194 lb (88 kg)  07/25/18 205 lb 7.5 oz (93.2 kg)     GEN:  Well nourished, well developed in no acute distress HEENT: Normal NECK: No JVD; No carotid bruits LYMPHATICS: No lymphadenopathy CARDIAC:RRR, no murmurs, rubs, gallops RESPIRATORY:  Clear to auscultation without rales, wheezing or rhonchi  ABDOMEN: Soft, non-tender, non-distended MUSCULOSKELETAL:  No edema; No deformity  SKIN: Warm and dry NEUROLOGIC:  Alert and oriented x 3 PSYCHIATRIC:  Normal affect   ASSESSMENT:    No diagnosis found. PLAN:    In order of problems listed above:  1. CAD: No angina.   2. Hypertensive heart disease: The current medical regimen is effective;  continue present plan and medications. 3. Hyperlipidemia: He has myalgias that he thinks are related to statin.  He is willing to try Repatha.  WIll refer to PharmD. 4. Ischemic cardiomyopathy: appears euvolemic.   Medication Adjustments/Labs and Tests Ordered: Current medicines are reviewed at length with the patient today.  Concerns regarding medicines are outlined above.  No orders of the defined types were placed in this encounter.  No orders of the defined types were placed in this encounter.   There are no Patient Instructions on file for this visit.   Signed, Larae Grooms, MD  06/04/2019 10:08 PM    [place]

## 2019-06-05 ENCOUNTER — Telehealth: Payer: Self-pay | Admitting: Interventional Cardiology

## 2019-06-05 NOTE — Telephone Encounter (Signed)
Called and spoke to patient. Made him aware that with COVID we are limiting visitors for patient's with a physical or mental impairment. Made patient aware that we can call the patient's wife during the visit if he would like. He verbalized understanding and thanked me for the call.

## 2019-06-05 NOTE — Telephone Encounter (Signed)
New Message  Patient is calling in to get approval for his wife to accompany him to his appointment. Patient states that he does not have a medical necessity, but he does need his wife there to hear what the doctor has to say on 06/06/19 at 3:00pm.

## 2019-06-06 ENCOUNTER — Ambulatory Visit: Payer: Medicare Other | Admitting: Interventional Cardiology

## 2019-06-06 ENCOUNTER — Encounter: Payer: Self-pay | Admitting: Interventional Cardiology

## 2019-06-06 ENCOUNTER — Other Ambulatory Visit: Payer: Self-pay

## 2019-06-06 VITALS — BP 138/78 | HR 55 | Ht 69.75 in | Wt 206.8 lb

## 2019-06-06 DIAGNOSIS — I2510 Atherosclerotic heart disease of native coronary artery without angina pectoris: Secondary | ICD-10-CM | POA: Diagnosis not present

## 2019-06-06 DIAGNOSIS — I10 Essential (primary) hypertension: Secondary | ICD-10-CM | POA: Diagnosis not present

## 2019-06-06 DIAGNOSIS — I255 Ischemic cardiomyopathy: Secondary | ICD-10-CM

## 2019-06-06 DIAGNOSIS — E785 Hyperlipidemia, unspecified: Secondary | ICD-10-CM | POA: Diagnosis not present

## 2019-06-06 NOTE — Patient Instructions (Signed)
Medication Instructions:  Your physician recommends that you continue on your current medications as directed. Please refer to the Current Medication list given to you today.  *If you need a refill on your cardiac medications before your next appointment, please call your pharmacy*  Lab Work: None ordered  If you have labs (blood work) drawn today and your tests are completely normal, you will receive your results only by: Marland Kitchen MyChart Message (if you have MyChart) OR . A paper copy in the mail If you have any lab test that is abnormal or we need to change your treatment, we will call you to review the results.  Testing/Procedures: None ordered  Follow-Up: At [facility], you and your health needs are our priority.  As part of our continuing mission to provide you with exceptional heart care, we have created designated Provider Care Teams.  These Care Teams include your primary Cardiologist (physician) and Advanced Practice Providers (APPs -  Physician Assistants and Nurse Practitioners) who all work together to provide you with the care you need, when you need it.  Your next appointment:   June 2021  The format for your next appointment:   In Person  Provider:   You may see Larae Grooms, MD or one of the following Advanced Practice Providers on your designated Care Team:    Melina Copa, PA-C  Ermalinda Barrios, PA-C   Other Instructions   We will send a message to our PharmD about getting you set up with one of the PCSK9 inhibitors for your cholesterol

## 2019-06-11 ENCOUNTER — Other Ambulatory Visit: Payer: Self-pay

## 2019-06-11 ENCOUNTER — Ambulatory Visit (INDEPENDENT_AMBULATORY_CARE_PROVIDER_SITE_OTHER): Payer: Medicare Other

## 2019-06-11 VITALS — BP 138/80 | HR 57 | Wt 207.0 lb

## 2019-06-11 DIAGNOSIS — E785 Hyperlipidemia, unspecified: Secondary | ICD-10-CM | POA: Diagnosis not present

## 2019-06-11 DIAGNOSIS — G720 Drug-induced myopathy: Secondary | ICD-10-CM

## 2019-06-11 DIAGNOSIS — T466X5A Adverse effect of antihyperlipidemic and antiarteriosclerotic drugs, initial encounter: Secondary | ICD-10-CM

## 2019-06-11 NOTE — Patient Instructions (Addendum)
It was nice to see you today!  Stop taking your atorvastatin.  We will recheck your cholesterol in 3 weeks with being off your statin.  Once your LDL is above 70, we will be able to submit the prior authorization to your insurance company for [place]. This is the injection you would do once every two weeks. We will call you once everything is approved and sent over to the pharmacy.  When you get your new insurance card, please give [place] a call to let us know what the new information is.  Our phone number is [phone]

## 2019-06-11 NOTE — Progress Notes (Signed)
Patient ID: Alex Herrera                 DOB: [birth year]                    MRN: [id number]     HPI: Isen Mayotte is a 68 y.o. male patient referred to lipid clinic by Dr. Irish Lack. PMH is significant for CAD (remote PCI to RCA 2015, STEMI 04/2018 s/p DES to LAD x 2 with acute diastolic CHF), HTN, and HLD. He was last seen by Dr. Irish Lack on 05/27/19. At this visit, he reported myalgias with atorvastatin 80 mg daily and had similar side effects when he was on Crestor, pravastatin, and Livalo.  Patient presents to lipid clinic for initial visit. He states that he has had severe muscle pain with each statin he has tried in the past, with Crestor being the least tolerated statin. He was told by one of his doctors in the past that he could try taking his atorvastatin every other day to see if this would help with tolerability but he said it didn't help. He has gone back to taking atorvastatin 80 mg once daily with skipping some doses occasionally if he felt he ate well that day. Over the last month, he says he missed about 6-8 doses of atorvastatin.   Current Medications: atorvastatin 80 mg daily  Intolerances: Crestor 20 mg daily (myalgias), pravastatin 40 mg daily (myalgias), Livalo 2 mg daily (myalgias)  Risk Factors: CAD, HTN, HLD, CHF  LDL goal: <70  Diet: cooks meals at home, salads nightly, mostly chicken, fish when going out to restaurants, vegetables, and fruits  Exercise: walks 3 miles 3-4 nights per week + yard work, stays active  Family History: COPD in his father; Diabetes in his mother; Heart attack in his brother; Hypertension in his father and mother  Social History: never smoker, little alcohol   Labs: 10/31/18: TC 92, TG 53, HDL 45, nonHDL 47, LDL 37 ([facility]) - atorvastatin 80 mg daily 04/26/18: TC 146, TG 92, HDL 57, LDL 71 - Livalo 2 mg daily  Past Medical History:  Diagnosis Date  . Acute CHF (congestive heart failure) ([place])    a. presumed diastolic LVEDP 39 during  cath 04/2018.  Marland Kitchen Allergic rhinitis   . Arthritis   . Coronary artery disease    a. PCI to RCA 2015. b. STEMI 04/2018 s/p DESx2 to LAD, EF 55-60%.  . Hearing loss   . History of kidney stones   . Hyperlipidemia   . Hypertension   . Skin cancer, basal cell 2000    Current Outpatient Medications on File Prior to Visit  Medication Sig Dispense Refill  . acetaminophen (TYLENOL) 325 MG tablet Take 2 tablets (650 mg total) by mouth every 4 (four) hours as needed for headache or mild pain.    Marland Kitchen aspirin 81 MG chewable tablet Chew 1 tablet (81 mg total) by mouth daily.    Marland Kitchen atorvastatin (LIPITOR) 80 MG tablet Take 1 tablet (80 mg total) by mouth daily. 90 tablet 3  . Azilsartan Medoxomil (EDARBI) 80 MG TABS Take 80 mg by mouth daily.     . carvedilol (COREG) 6.25 MG tablet Take 1 tablet (6.25 mg total) by mouth 2 (two) times daily. 180 tablet 3  . Coenzyme Q10 (COQ10 PO) Take 1 capsule by mouth daily.    . fluticasone (FLONASE) 50 MCG/ACT nasal spray Place 2 sprays into both nostrils daily as needed (seasonal allergies).     Marland Kitchen  gabapentin (NEURONTIN) 100 MG capsule Take 1 capsule (100 mg total) by mouth 3 (three) times daily. 90 capsule 3  . gabapentin (NEURONTIN) 300 MG capsule Take 300 mg by mouth at bedtime.    Marland Kitchen GLUCOSAMINE-CHONDROITIN PO Take 2 tablets by mouth daily.    . hydrochlorothiazide (MICROZIDE) 12.5 MG capsule Take 12.5 mg by mouth daily.    . mometasone (ELOCON) 0.1 % cream Apply 1 application topically daily as needed (wound care).    . nitroGLYCERIN (NITROSTAT) 0.4 MG SL tablet Place 1 tablet (0.4 mg total) under the tongue every 5 (five) minutes as needed for chest pain. 25 tablet 3  . prasugrel (EFFIENT) 10 MG TABS tablet Take 60 mg on first dose tomorrow 06/29/18. Beginning on 06/30/18 start taking 1 tablet daily 100 tablet 3  . Saw Palmetto, Serenoa repens, (SAW PALMETTO PO) Take 2 capsules by mouth daily.    . valACYclovir (VALTREX) 1000 MG tablet Take 1,000 mg by mouth daily.       No current facility-administered medications on file prior to visit.     Allergies  Allergen Reactions  . Other Hives    Mycins *tolerates zithromax* (unsure which medication caused the reaction as a teenager)  . Penicillins Hives    Has patient had a PCN reaction causing immediate rash, facial/tongue/throat swelling, SOB or lightheadedness with hypotension: Yes Has patient had a PCN reaction causing severe rash involving mucus membranes or skin necrosis: No Has patient had a PCN reaction that required hospitalization: No Has patient had a PCN reaction occurring within the last 10 years: No If all of the above answers are "NO", then may proceed with Cephalosporin use.  Marland Kitchen Crestor [Rosuvastatin Calcium]     Myalgias    Assessment/Plan:  1. Hyperlipidemia - LDL is within goal at 37 (goal < 70) on atorvastatin 80 mg daily. Patient is unable to tolerate atorvastatin due to myalgias. Stop atorvastatin. Repeat lipid panel and LFTs in 3 weeks to check LDL off statin therapy (he has been skipping doses this month due to myalgias). Once LDL > 70, start Repatha 140 mg [place] once every 2 weeks. Will submit prior authorization prior to sending to pharmacy (insurance will not cover therapy until LDL is > 70). Patient educated and counseled on Repatha during the visit today. Check 2 month lipid panel and LFTs again after starting Repatha. Of note, patient states his insurance plan will change for next year. Instructed pt to call lipid clinic with new insurance information so we can resubmit prior authorization to new plan if needed.   Vertis Kelch, PharmD PGY2 Cardiology Pharmacy Resident [facility] [id number], [place],  [address] Phone: [phone]; Fax: [phone]

## 2019-06-27 ENCOUNTER — Other Ambulatory Visit: Payer: Self-pay | Admitting: Interventional Cardiology

## 2019-06-27 MED ORDER — PRASUGREL HCL 10 MG PO TABS: 10.0000 mg | ORAL_TABLET | Freq: Every day | ORAL | 3 refills | Status: DC

## 2019-07-02 ENCOUNTER — Other Ambulatory Visit: Payer: Medicare Other

## 2019-07-03 ENCOUNTER — Other Ambulatory Visit: Payer: Self-pay

## 2019-07-03 ENCOUNTER — Telehealth: Payer: Self-pay | Admitting: Pharmacist

## 2019-07-03 ENCOUNTER — Other Ambulatory Visit: Payer: Medicare Other | Admitting: *Deleted

## 2019-07-03 DIAGNOSIS — E785 Hyperlipidemia, unspecified: Secondary | ICD-10-CM

## 2019-07-03 LAB — HEPATIC FUNCTION PANEL
ALT: 20 IU/L (ref 0–44)
AST: 21 IU/L (ref 0–40)
Albumin: 4.2 g/dL (ref 3.8–4.8)
Alkaline Phosphatase: 96 IU/L (ref 39–117)
Bilirubin Total: 0.4 mg/dL (ref 0.0–1.2)
Bilirubin, Direct: 0.12 mg/dL (ref 0.00–0.40)
Total Protein: 6.5 g/dL (ref 6.0–8.5)

## 2019-07-03 LAB — LIPID PANEL
Chol/HDL Ratio: 3.2 ratio (ref 0.0–5.0)
Cholesterol, Total: 187 mg/dL (ref 100–199)
HDL: 58 mg/dL (ref >39)
LDL Chol Calc (NIH): 110 mg/dL — ABNORMAL HIGH (ref 0–99)
Triglycerides: 108 mg/dL (ref 0–149)
VLDL Cholesterol Cal: 19 mg/dL (ref 5–40)

## 2019-07-03 NOTE — Telephone Encounter (Signed)
Pt presented today for fasting lipid panel after he has been off atorvastatin 67m for 4 weeks. New baseline required prior to submitting for PCSK9i coverage. Pt previously intolerant to rosuvastatin 233mdaily, pravastatin 4078maily, and Livalo 2mg35mily.   New baseline LDL: 110mg57mWill submit prior authorization for Repatha.  Pt also signed informed consent for cvMOBIUS lipid registry:  [employer] Evolocumab [id number] No: [id number] ID No: [id number]     Did subject meet all eligibility criteria?  Code  No Yes  101  16lts age ? 40 years []  [x]   One or both of the following:   102 A  Hospitalization for a clinical ASCVD event: acute (MI), UA, IS, or CLI within 18 months of enrollment        Note: Subjects must have been admitted to the hospital. Those       who are admitted and discharged in less than 24 hours are      Eligible for the study.Subjects who have been admitted to     The ER for a clinical ASCVD. []  [x]   102B Coronary or peripheral revascularization including percutaneous or surgical revascularization in the past 18 months []  [x]   One of the following:  103 A  LDL ? 70 mg/dL (1.81 mmol/L) with no plans for immediate initiation or titration of statin therapy []  [x]   103 B Newly started on PCSK9i after the index hospitalization/procedure and prior to enrollment (but no more than 6 months prior to enrollment) with pre-PCSK9i treatment LDL-C value available and measured within 6 months of starting PCSK9i and known background LLT any time prior to PCSK9i initiation. [x]  []   105 Planned follow-up within the health system []  [x]   Subject does not meet any of the following exclusion criteria   201 Unable or unwilling to provide informed consent, including but not limited to cognitive or language barriers (reading or comprehension) [x]  []   202 Lack of phone or email for contact [x]  []   203 Evidence of end stage renal disease (ESRD) or stage 5 CKD [x]  []   204 Anticipated life  expectancy less than 6 month  [x]  []   206  On a PCSK9i prior to their qualifying event [x]  []      Subject Name: EdwinKorrey Schleicherject met inclusion and exclusion criteria.  The informed consent form, study requirements and expectations were reviewed with the subject and questions and concerns were addressed prior to the signing of the consent form.  The subject verbalized understanding of the trial requirements.  The subject agreed to participate in the CVMOBMelbal and signed the informed consent at 10:15am on 07/03/19.  The informed consent was obtained prior to performance of any protocol-specific procedures for the subject.  A copy of the signed informed consent was given to the subject and a copy was placed in the subject's medical record.    Supple   Socioeconomic Status - Baseline  Education Some High School or Less High School graduate Some College/University Graduated College/University or above []  []  []  [x]    Marital Status Married Single Divorced Separated [x]  []  []  []    Income <$15,000 $15,001 - $34,999 $35,000 - $74,999 $75,000 - $99,999 >$100,000 []  []  [x]  []  [] 

## 2019-07-04 ENCOUNTER — Telehealth: Payer: Self-pay

## 2019-07-04 DIAGNOSIS — E785 Hyperlipidemia, unspecified: Secondary | ICD-10-CM

## 2019-07-04 MED ORDER — REPATHA SURECLICK 140 MG/ML ~~LOC~~ SOAJ: 1.0000 "pen " | SUBCUTANEOUS | 11 refills | Status: DC

## 2019-07-04 NOTE — Addendum Note (Signed)
Addended by: Marcelle Overlie D on: 07/04/2019 04:29 PM   Modules accepted: Orders

## 2019-07-04 NOTE — Telephone Encounter (Addendum)
PA approved through 01/02/2020. Rx sent to pharmacy

## 2019-07-04 NOTE — Telephone Encounter (Signed)
LDL after stopping atorvastatin (due to myalgias) is 110. Prior authorization submitted to insurance company for Delevan. Pt aware and will be notified once prior authorization approved and medication is sent over to the pharmacy to be picked up.  Will need repeat lipid panel and LFTs after the 4th injection.

## 2019-07-07 NOTE — Telephone Encounter (Signed)
Repatha is ready to be picked up at pharmacy. Cost is $40. Called patient to make him aware and see if this is affordable. Left voicemail for patient to return call.

## 2019-07-10 NOTE — Telephone Encounter (Signed)
Spoke with patient. He picked up medication on Monday. $40 is fine with him. Gave himself first injection already. Patient set up for labs on 2/17

## 2019-07-10 NOTE — Addendum Note (Signed)
Addended by: Marcelle Overlie D on: 07/10/2019 05:04 PM   Modules accepted: Orders

## 2019-08-13 ENCOUNTER — Telehealth: Payer: Self-pay | Admitting: Pharmacist

## 2019-08-13 NOTE — Telephone Encounter (Signed)
Patient called and left voicemail that his insurance is now [place] and he needs a prior auth for his Repatha. Will need his insurance info to submit PA. I have left VM for patient to return call for insurance info.

## 2019-08-14 ENCOUNTER — Ambulatory Visit: Payer: Medicare PPO | Attending: Internal Medicine

## 2019-08-14 DIAGNOSIS — Z23 Encounter for immunization: Secondary | ICD-10-CM

## 2019-08-14 NOTE — Progress Notes (Signed)
   Covid-19 Vaccination Clinic  Name:  Cherry Mazzaferro    MRN: [id number] DOB: November 15, 1950  08/14/2019  Mr. Fierro was observed post Covid-19 immunization for 15 minutes without incidence. He was provided with Vaccine Information Sheet and instruction to access the V-Safe system.   Mr. Peller was instructed to call 911 with any severe reactions post vaccine: Marland Kitchen Difficulty breathing  . Swelling of your face and throat  . A fast heartbeat  . A bad rash all over your body  . Dizziness and weakness    Immunizations Administered    Name Date Dose VIS Date Route   Pfizer COVID-19 Vaccine 08/14/2019 10:57 AM 0.3 mL 07/04/2019 Intramuscular   Manufacturer: [place]   Lot: [id number]   [place]: [id number]

## 2019-08-14 NOTE — Telephone Encounter (Signed)
Pt has updated his insurance information

## 2019-08-14 NOTE — Telephone Encounter (Signed)
Submitted a pa on cmm and awaiting determination

## 2019-08-15 NOTE — Telephone Encounter (Signed)
Prior auth for Repatha has been approved, left voicemail for pt to make him aware.

## 2019-09-03 ENCOUNTER — Ambulatory Visit: Payer: Medicare PPO | Attending: Internal Medicine

## 2019-09-03 DIAGNOSIS — Z23 Encounter for immunization: Secondary | ICD-10-CM | POA: Insufficient documentation

## 2019-09-03 NOTE — Progress Notes (Signed)
   Covid-19 Vaccination Clinic  Name:  Alex Herrera    MRN: [id number] DOB: 15-Nov-1950  09/03/2019  Alex Herrera was observed post Covid-19 immunization for 15 minutes without incidence. He was provided with Vaccine Information Sheet and instruction to access the V-Safe system.   Alex Herrera was instructed to call 911 with any severe reactions post vaccine: Marland Kitchen Difficulty breathing  . Swelling of your face and throat  . A fast heartbeat  . A bad rash all over your body  . Dizziness and weakness    Immunizations Administered    Name Date Dose VIS Date Route   Pfizer COVID-19 Vaccine 09/03/2019  4:11 PM 0.3 mL 07/04/2019 Intramuscular   Manufacturer: [place]   Lot: [id number]   [place]: [id number]

## 2019-09-10 ENCOUNTER — Other Ambulatory Visit: Payer: Self-pay

## 2019-09-10 ENCOUNTER — Other Ambulatory Visit: Payer: Medicare PPO | Admitting: *Deleted

## 2019-09-10 DIAGNOSIS — E785 Hyperlipidemia, unspecified: Secondary | ICD-10-CM

## 2019-09-10 LAB — LIPID PANEL
Chol/HDL Ratio: 1.8 ratio (ref 0.0–5.0)
Cholesterol, Total: 93 mg/dL — ABNORMAL LOW (ref 100–199)
HDL: 53 mg/dL (ref >39)
LDL Chol Calc (NIH): 20 mg/dL (ref 0–99)
Triglycerides: 113 mg/dL (ref 0–149)
VLDL Cholesterol Cal: 20 mg/dL (ref 5–40)

## 2019-09-10 LAB — HEPATIC FUNCTION PANEL
ALT: 18 IU/L (ref 0–44)
AST: 22 IU/L (ref 0–40)
Albumin: 4.1 g/dL (ref 3.8–4.8)
Alkaline Phosphatase: 89 IU/L (ref 39–117)
Bilirubin Total: 0.3 mg/dL (ref 0.0–1.2)
Bilirubin, Direct: 0.14 mg/dL (ref 0.00–0.40)
Total Protein: 6.4 g/dL (ref 6.0–8.5)

## 2020-02-26 ENCOUNTER — Other Ambulatory Visit: Payer: Self-pay | Admitting: Interventional Cardiology

## 2020-03-22 NOTE — Progress Notes (Deleted)
Cardiology Office Note   Date:  03/22/2020   ID:  Alex Herrera, DOB [birth year], MRN [id number]  PCP:  Maury Dus, MD    No chief complaint on file.  CAD  Wt Readings from Last 3 Encounters:  06/11/19 207 lb (93.9 kg)  06/06/19 206 lb 12.8 oz (93.8 kg)  02/05/19 198 lb 13.7 oz (90.2 kg)       History of Present Illness: Alex Herrera is a 69 y.o. male  with a hx of CAD who was previously seen by Dr. Wynonia Lawman.  He has ahistory ofCAD (remote PCI to RCA 2015, STEMI 04/2018 s/p DES to LAD x 2 with acute diastolic CHF), HTN, HLD who presents for f/u of CAD. He was previously followed by Dr. Wynonia Lawman. The patient presented to [facility] 04/26/18 with chest pain. Initial EKG nonacute, but repeat EKG with recurrent chest pain noted dramatic ST elevation and code STEMI was called. He unerwent urgent cath with results below.  His LVEDP was elevated and he was treated with IV Lasix x 1. Troponin peaked at 20. Echo 04/27/18 showed preserved LVF with an EF of 55-60% but hypokinesis of mid anteroseptal myocardium; normal diastolic parameters. He was on Livalo and Plavix prior to DC, sent home on Atorvastatin and Brilinta. 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 He has history of myalgias with Crestor. At f/u 05/13/18 BP was elevated so metoprolol was switched to carvedilol.Followuplabs otherwise showed normal CBC, K 3.9, Cr 1.12, LDL 71, no recent LFTs on file.  Cath in 10/19 showed:  Previously placed Prox RCA stent (unknown type) is widely patent.  Prox LAD to Mid LAD lesion is 80% stenosed.  A drug-eluting stent was successfully placed using a STENT SYNERGY DES 3X38.  Post intervention, there is a 0% residual stenosis.  Ost LAD to Prox LAD lesion is 99% stenosed.  A drug-eluting stent was successfully placed using a STENT SYNERGY DES 3.5X8.  Post intervention, there is a 0% residual stenosis.  LV end diastolic pressure is severely elevated. 39 mm Hg.  There is no  aortic valve stenosis.  Recommend uninterrupted dual antiplatelet therapy with Aspirin 81mg  daily and Ticagrelor 90mg  twice dailyfor a minimum of 12 months (ACS - Class I recommendation).  He has been on several statins in the past resulting in muscle aches.  He has tried Crestor, pravastatin. and atorvastatin and feels similar pain.  THe more he works, the more he aches.    Past Medical History:  Diagnosis Date  . Acute CHF (congestive heart failure) ([place])    a. presumed diastolic LVEDP 39 during cath 04/2018.  Marland Kitchen Allergic rhinitis   . Arthritis   . Coronary artery disease    a. PCI to RCA 2015. b. STEMI 04/2018 s/p DESx2 to LAD, EF 55-60%.  . Hearing loss   . History of kidney stones   . Hyperlipidemia   . Hypertension   . Skin cancer, basal cell 2000    Past Surgical History:  Procedure Laterality Date  . CARDIAC CATHETERIZATION    . COLONOSCOPY    . CORONARY STENT INTERVENTION N/A 04/26/2018   Procedure: CORONARY STENT INTERVENTION;  Surgeon: Jettie Booze, MD;  Location: [facility];  Service: Cardiovascular;  Laterality: N/A;  . LEFT HEART CATH AND CORONARY ANGIOGRAPHY N/A 04/26/2018   Procedure: LEFT HEART CATH AND CORONARY ANGIOGRAPHY;  Surgeon: Jettie Booze, MD;  Location: [facility];  Service: Cardiovascular;  Laterality: N/A;  . LEFT HEART CATHETERIZATION  WITH CORONARY ANGIOGRAM N/A 01/22/2014   Procedure: LEFT HEART CATHETERIZATION WITH CORONARY ANGIOGRAM;  Surgeon: Jacolyn Reedy, MD;  Location: [facility] C[facility] LAB;  Service: Cardiovascular;  Laterality: N/A;  . NASAL SEPTUM SURGERY     1970's  . PERCUTANEOUS CORONARY STENT INTERVENTION (PCI-S)  01/22/2014   Procedure: PERCUTANEOUS CORONARY STENT INTERVENTION (PCI-S);  Surgeon: Jacolyn Reedy, MD;  Location: [facility] CATH LAB;  Service: Cardiovascular;;  . PTCA  01/22/2014   PTCA/DES 1 PROXIMAL RCA           DR TILLEY  . VASECTOMY       Current Outpatient Medications  Medication Sig Dispense  Refill  . acetaminophen (TYLENOL) 325 MG tablet Take 2 tablets (650 mg total) by mouth every 4 (four) hours as needed for headache or mild pain.    Marland Kitchen aspirin 81 MG chewable tablet Chew 1 tablet (81 mg total) by mouth daily.    Marland Kitchen atorvastatin (LIPITOR) 80 MG tablet Take 1 tablet (80 mg total) by mouth daily. 90 tablet 3  . Azilsartan Medoxomil (EDARBI) 80 MG TABS Take 80 mg by mouth daily.     . carvedilol (COREG) 6.25 MG tablet Take 1 tablet (6.25 mg total) by mouth 2 (two) times daily. Please make yearly appt with Dr. Irish Lack for November for future refills. 1st attempt 180 tablet 0  . Coenzyme Q10 (COQ10 PO) Take 1 capsule by mouth daily.    . Evolocumab (REPATHA SURECLICK) 130 MG/ML SOAJ Inject 1 pen into the skin every 14 (fourteen) days. 2 pen 11  . fluticasone (FLONASE) 50 MCG/ACT nasal spray Place 2 sprays into both nostrils daily as needed (seasonal allergies).     . gabapentin (NEURONTIN) 100 MG capsule Take 1 capsule (100 mg total) by mouth 3 (three) times daily. 90 capsule 3  . gabapentin (NEURONTIN) 300 MG capsule Take 300 mg by mouth at bedtime.    Marland Kitchen GLUCOSAMINE-CHONDROITIN PO Take 2 tablets by mouth daily.    . hydrochlorothiazide (MICROZIDE) 12.5 MG capsule Take 12.5 mg by mouth daily.    . mometasone (ELOCON) 0.1 % cream Apply 1 application topically daily as needed (wound care).    . nitroGLYCERIN (NITROSTAT) 0.4 MG SL tablet Place 1 tablet (0.4 mg total) under the tongue every 5 (five) minutes as needed for chest pain. 25 tablet 3  . prasugrel (EFFIENT) 10 MG TABS tablet Take 1 tablet (10 mg total) by mouth daily. 90 tablet 3  . Saw Palmetto, Serenoa repens, (SAW PALMETTO PO) Take 2 capsules by mouth daily.    . valACYclovir (VALTREX) 1000 MG tablet Take 1,000 mg by mouth daily.      No current facility-administered medications for this visit.    Allergies:   Other, Penicillins, and Crestor [rosuvastatin calcium]    Social History:  The patient  reports that he has never  smoked. He has never used smokeless tobacco. He reports that he does not drink alcohol and does not use drugs.   Family History:  The patient's ***family history includes COPD in his father; Diabetes in his mother; Heart attack in his brother; Hypertension in his father and mother.    ROS:  Please see the history of present illness.   Otherwise, review of systems are positive for ***.   All other systems are reviewed and negative.    PHYSICAL EXAM: VS:  There were no vitals taken for this visit. , BMI There is no height or weight on file to calculate BMI. GEN: Well nourished,  well developed, in no acute distress  HEENT: normal  Neck: no JVD, carotid bruits, or masses Cardiac: ***RRR; no murmurs, rubs, or gallops,no edema  Respiratory:  clear to auscultation bilaterally, normal work of breathing GI: soft, nontender, nondistended, + BS MS: no deformity or atrophy  Skin: warm and dry, no rash Neuro:  Strength and sensation are intact Psych: euthymic mood, full affect   EKG:   The ekg ordered today demonstrates ***   Recent Labs: 09/10/2019: ALT 18   Lipid Panel    Component Value Date/Time   CHOL 93 (L) 09/10/2019 0913   TRIG 113 09/10/2019 0913   HDL 53 09/10/2019 0913   CHOLHDL 1.8 09/10/2019 0913   CHOLHDL 2.6 04/26/2018 1313   VLDL 18 04/26/2018 1313   LDLCALC 20 09/10/2019 0913     Other studies Reviewed: Additional studies/ records that were reviewed today with results demonstrating: ***.   ASSESSMENT AND PLAN:  1.   CAD:  2.   Hypertensive heart disease: 3.   Hyperlipidemia: Ischemic cardiomyopathy:   Current medicines are reviewed at length with the patient today.  The patient concerns regarding his medicines were addressed.  The following changes have been made:  No change***  Labs/ tests ordered today include: *** No orders of the defined types were placed in this encounter.   Recommend 150 minutes/week of aerobic exercise Low fat, low carb, high  fiber diet recommended  Disposition:   FU in ***   Signed, Larae Grooms, MD  03/22/2020 11:31 AM    [facility] [place], [place], [place]  [address] Phone: [phone]; Fax: [phone]

## 2020-03-23 ENCOUNTER — Ambulatory Visit: Payer: Medicare PPO | Admitting: Interventional Cardiology

## 2020-04-05 DIAGNOSIS — H6983 Other specified disorders of Eustachian tube, bilateral: Secondary | ICD-10-CM | POA: Diagnosis not present

## 2020-04-05 DIAGNOSIS — M545 Low back pain: Secondary | ICD-10-CM | POA: Diagnosis not present

## 2020-05-06 DIAGNOSIS — Z20822 Contact with and (suspected) exposure to covid-19: Secondary | ICD-10-CM | POA: Diagnosis not present

## 2020-05-10 ENCOUNTER — Ambulatory Visit: Payer: Medicare PPO | Admitting: Interventional Cardiology

## 2020-05-27 NOTE — Progress Notes (Signed)
Cardiology Office Note   Date:  05/31/2020   ID:  Alex Herrera, DOB 10/15/1950, MRN [id number]  PCP:  Maury Dus, MD    No chief complaint on file.  CAD  Wt Readings from Last 3 Encounters:  05/31/20 202 lb 3.2 oz (91.7 kg)  06/11/19 207 lb (93.9 kg)  06/06/19 206 lb 12.8 oz (93.8 kg)       History of Present Illness: Alex Herrera is a 69 y.o. male  with a hx of CAD who was previously seen by Dr. Wynonia Lawman.  He has ahistory ofCAD (remote PCI to RCA 2015, STEMI 04/2018 s/p DES to LAD x 2 with acute diastolic CHF), HTN, HLD who presents for f/u of CAD. He was previously followed by Dr. Wynonia Lawman. The patient presented to [facility] 04/26/18 with chest pain. Initial EKG nonacute, but repeat EKG with recurrent chest pain noted dramatic ST elevation and code STEMI was called. He unerwent urgent cath with results below.  His LVEDP was elevated and he was treated with IV Lasix x 1. Troponin peaked at 20. Echo 04/27/18 showed preserved LVF with an EF of 55-60% but hypokinesis of mid anteroseptal myocardium; normal diastolic parameters. He was on Livalo and Plavix prior to DC, sent home on Atorvastatin and Brilinta. 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 He has history of myalgias with Crestor. At f/u 05/13/18 BP was elevated so metoprolol was switched to carvedilol.Followuplabs otherwise showed normal CBC, K 3.9, Cr 1.12, LDL 71, no recent LFTs on file.  Cath in 10/19 showed:  Previously placed Prox RCA stent (unknown type) is widely patent.  Prox LAD to Mid LAD lesion is 80% stenosed.  A drug-eluting stent was successfully placed using a STENT SYNERGY DES 3X38.  Post intervention, there is a 0% residual stenosis.  Ost LAD to Prox LAD lesion is 99% stenosed.  A drug-eluting stent was successfully placed using a STENT SYNERGY DES 3.5X8.  Post intervention, there is a 0% residual stenosis.  LV end diastolic pressure is severely elevated. 39 mm Hg.  There is no  aortic valve stenosis.  Recommend uninterrupted dual antiplatelet therapy with Aspirin 81mg  daily and Ticagrelor 90mg  twice dailyfor a minimum of 12 months (ACS - Class I recommendation).  He had body aches. He has been on several statins in the past.  He has tried Crestor, pravastatin.  He is on atorvastatin and had similar pain.  In 2020, he added some HCTZ for increased BP.  Readings improved at home.   Since the last visit, he has had somme GERD with spicy foods.    Denies : exertional Chest pain. Dizziness. Leg edema. Nitroglycerin use. Orthopnea. Palpitations. Paroxysmal nocturnal dyspnea. Shortness of breath. Syncope.   No regular walking routine.   Stays active.     Past Medical History:  Diagnosis Date  . Acute CHF (congestive heart failure) ([place])    a. presumed diastolic LVEDP 39 during cath 04/2018.  Marland Kitchen Allergic rhinitis   . Arthritis   . Coronary artery disease    a. PCI to RCA 2015. b. STEMI 04/2018 s/p DESx2 to LAD, EF 55-60%.  . Hearing loss   . History of kidney stones   . Hyperlipidemia   . Hypertension   . Skin cancer, basal cell 2000    Past Surgical History:  Procedure Laterality Date  . CARDIAC CATHETERIZATION    . COLONOSCOPY    . CORONARY STENT INTERVENTION N/A 04/26/2018   Procedure: CORONARY STENT INTERVENTION;  Surgeon: Larae Grooms  S, MD;  Location: [facility];  Service: Cardiovascular;  Laterality: N/A;  . LEFT HEART CATH AND CORONARY ANGIOGRAPHY N/A 04/26/2018   Procedure: LEFT HEART CATH AND CORONARY ANGIOGRAPHY;  Surgeon: Jettie Booze, MD;  Location: [facility];  Service: Cardiovascular;  Laterality: N/A;  . LEFT HEART CATHETERIZATION WITH CORONARY ANGIOGRAM N/A 01/22/2014   Procedure: LEFT HEART CATHETERIZATION WITH CORONARY ANGIOGRAM;  Surgeon: Jacolyn Reedy, MD;  Location: [facility];  Service: Cardiovascular;  Laterality: N/A;  . NASAL SEPTUM SURGERY     1970's  . PERCUTANEOUS CORONARY STENT INTERVENTION  (PCI-S)  01/22/2014   Procedure: PERCUTANEOUS CORONARY STENT INTERVENTION (PCI-S);  Surgeon: Jacolyn Reedy, MD;  Location: [facility] CATH [facility];  Service: Cardiovascular;;  . PTCA  01/22/2014   PTCA/DES 1 PROXIMAL RCA           DR TILLEY  . VASECTOMY       Current Outpatient Medications  Medication Sig Dispense Refill  . aspirin 81 MG chewable tablet Chew 1 tablet (81 mg total) by mouth daily.    . Azilsartan Medoxomil (EDARBI) 80 MG TABS Take 80 mg by mouth daily.     . carvedilol (COREG) 6.25 MG tablet Take 1 tablet (6.25 mg total) by mouth 2 (two) times daily. Please make yearly appt with Dr. Irish Lack for November for future refills. 1st attempt 180 tablet 0  . Coenzyme Q10 (COQ10 PO) Take 1 capsule by mouth daily.    . Evolocumab (REPATHA SURECLICK) 409 MG/ML SOAJ Inject 1 pen into the skin every 14 (fourteen) days. 2 pen 11  . fluticasone (FLONASE) 50 MCG/ACT nasal spray Place 2 sprays into both nostrils daily as needed (seasonal allergies).     . gabapentin (NEURONTIN) 100 MG capsule Take 1 capsule (100 mg total) by mouth 3 (three) times daily. 90 capsule 3  . hydrochlorothiazide (MICROZIDE) 12.5 MG capsule Take 12.5 mg by mouth daily.    . mometasone (ELOCON) 0.1 % cream Apply 1 application topically daily as needed (wound care).    . nitroGLYCERIN (NITROSTAT) 0.4 MG SL tablet Place 1 tablet (0.4 mg total) under the tongue every 5 (five) minutes as needed for chest pain. 25 tablet 3  . prasugrel (EFFIENT) 10 MG TABS tablet Take 1 tablet (10 mg total) by mouth daily. 90 tablet 3  . valACYclovir (VALTREX) 1000 MG tablet Take 1,000 mg by mouth daily.      No current facility-administered medications for this visit.    Allergies:   Other, Penicillins, and Crestor [rosuvastatin calcium]    Social History:  The patient  reports that he has never smoked. He has never used smokeless tobacco. He reports that he does not drink alcohol and does not use drugs.   Family History:  The patient's family  history includes COPD in his father; Diabetes in his mother; Heart attack in his brother; Hypertension in his father and mother.    ROS:  Please see the history of present illness.   Otherwise, review of systems are positive for GERD sx with spicy foods, sone nuisance bleeding .   All other systems are reviewed and negative.    PHYSICAL EXAM: VS:  BP 124/86   Pulse 76   Ht 5' 9.75" (1.772 m)   Wt 202 lb 3.2 oz (91.7 kg)   SpO2 94%   BMI 29.22 kg/m  , BMI Body mass index is 29.22 kg/m. GEN: Well nourished, well developed, in no acute distress  HEENT: normal  Neck: no JVD, carotid bruits, or masses Cardiac: RRR; no murmurs, rubs, or gallops,no edema  Respiratory:  clear to auscultation bilaterally, normal work of breathing GI: soft, nontender, nondistended, + BS MS: no deformity or atrophy , 2+ posterior tibial pulses bilaterally Skin: warm and dry, no rash Neuro:  Strength and sensation are intact Psych: euthymic mood, full affect   EKG:   The ekg ordered today demonstrates NSR, no ST changes   Recent Labs: 09/10/2019: ALT 18   Lipid Panel    Component Value Date/Time   CHOL 93 (L) 09/10/2019 0913   TRIG 113 09/10/2019 0913   HDL 53 09/10/2019 0913   CHOLHDL 1.8 09/10/2019 0913   CHOLHDL 2.6 04/26/2018 1313   VLDL 18 04/26/2018 1313   LDLCALC 20 09/10/2019 0913     Other studies Reviewed: Additional studies/ records that were reviewed today with results demonstrating: Labs reviewed.  LDL 20 in February 2021.  LFTs normal.  He will continue to get his labs with his primary care doctor.   ASSESSMENT AND PLAN:  1.   CAD: Continue aggressive secondary prevention.  No exertional angina.  Whole food, plant-based diet recommended.  Increase activity to target noted below.  Okay to stop aspirin.  Continue prasugrel.  This may help with nuisance bleeding. 2.   Hypertensive heart disease: Using HCTZ as needed.  Home blood pressure readings are typically well controlled.   Continue to monitor.  Avoid processed foods to avoid excess salt. 3.   Hyperlipidemia: Well-controlled on PCSK9 inhibitor.  Continue healthy diet. 4.   Ischemic cardiomyopathy: No heart failure symptoms.   Current medicines are reviewed at length with the patient today.  The patient concerns regarding his medicines were addressed.  The following changes have been made:  No change  Labs/ tests ordered today include:  No orders of the defined types were placed in this encounter.   Recommend 150 minutes/week of aerobic exercise Low fat, low carb, high fiber diet recommended  Disposition:   FU in 1 year   Signed, Larae Grooms, MD  05/31/2020 8:29 AM    [facility] [address], [place], [place]  [address] Phone: [phone]; Fax: [phone]

## 2020-05-31 ENCOUNTER — Other Ambulatory Visit: Payer: Self-pay

## 2020-05-31 ENCOUNTER — Encounter: Payer: Self-pay | Admitting: Interventional Cardiology

## 2020-05-31 ENCOUNTER — Ambulatory Visit: Payer: Medicare PPO | Admitting: Interventional Cardiology

## 2020-05-31 VITALS — BP 124/86 | HR 76 | Ht 69.75 in | Wt 202.2 lb

## 2020-05-31 DIAGNOSIS — T466X5A Adverse effect of antihyperlipidemic and antiarteriosclerotic drugs, initial encounter: Secondary | ICD-10-CM

## 2020-05-31 DIAGNOSIS — I2510 Atherosclerotic heart disease of native coronary artery without angina pectoris: Secondary | ICD-10-CM

## 2020-05-31 DIAGNOSIS — G720 Drug-induced myopathy: Secondary | ICD-10-CM

## 2020-05-31 DIAGNOSIS — E785 Hyperlipidemia, unspecified: Secondary | ICD-10-CM | POA: Diagnosis not present

## 2020-05-31 DIAGNOSIS — I10 Essential (primary) hypertension: Secondary | ICD-10-CM | POA: Diagnosis not present

## 2020-05-31 NOTE — Patient Instructions (Signed)
Medication Instructions:  Your physician has recommended you make the following change in your medication:   STOP: Aspirin  *If you need a refill on your cardiac medications before your next appointment, please call your pharmacy*   Lab Work: None  If you have labs (blood work) drawn today and your tests are completely normal, you will receive your results only by: Marland Kitchen MyChart Message (if you have MyChart) OR . A paper copy in the mail If you have any lab test that is abnormal or we need to change your treatment, we will call you to review the results.   Testing/Procedures: None   Follow-Up: At [facility], you and your health needs are our priority.  As part of our continuing mission to provide you with exceptional heart care, we have created designated Provider Care Teams.  These Care Teams include your primary Cardiologist (physician) and Advanced Practice Providers (APPs -  Physician Assistants and Nurse Practitioners) who all work together to provide you with the care you need, when you need it.  We recommend signing up for the patient portal called "MyChart".  Sign up information is provided on this After Visit Summary.  MyChart is used to connect with patients for Virtual Visits (Telemedicine).  Patients are able to view lab/test results, encounter notes, upcoming appointments, etc.  Non-urgent messages can be sent to your provider as well.   To learn more about what you can do with MyChart, go to [email or web address].    Your next appointment:   12 month(s)  The format for your next appointment:   In Person  Provider:   You may see Larae Grooms, MD or one of the following Advanced Practice Providers on your designated Care Team:    Melina Copa, PA-C  Ermalinda Barrios, PA-C    Other Instructions  High-Fiber Diet Fiber, also called dietary fiber, is a type of carbohydrate that is found in fruits, vegetables, whole grains, and beans. A high-fiber diet can have  many health benefits. Your health care provider may recommend a high-fiber diet to help:  Prevent constipation. Fiber can make your bowel movements more regular.  Lower your cholesterol.  Relieve the following conditions: ? Swelling of veins in the anus (hemorrhoids). ? Swelling and irritation (inflammation) of specific areas of the digestive tract (uncomplicated diverticulosis). ? A problem of the large intestine (colon) that sometimes causes pain and diarrhea (irritable bowel syndrome, IBS).  Prevent overeating as part of a weight-loss plan.  Prevent heart disease, type 2 diabetes, and certain cancers. What is my plan? The recommended daily fiber intake in grams (g) includes:  38 g for men age 22 or younger.  30 g for men over age 65.  37 g for women age 61 or younger.  21 g for women over age 59. You can get the recommended daily intake of dietary fiber by:  Eating a variety of fruits, vegetables, grains, and beans.  Taking a fiber supplement, if it is not possible to get enough fiber through your diet. What do I need to know about a high-fiber diet?  It is better to get fiber through food sources rather than from fiber supplements. There is not a lot of research about how effective supplements are.  Always check the fiber content on the nutrition facts label of any prepackaged food. Look for foods that contain 5 g of fiber or more per serving.  Talk with a diet and nutrition specialist (dietitian) if you have questions about specific foods that  are recommended or not recommended for your medical condition, especially if those foods are not listed below.  Gradually increase how much fiber you consume. If you increase your intake of dietary fiber too quickly, you may have bloating, cramping, or gas.  Drink plenty of water. Water helps you to digest fiber. What are tips for following this plan?  Eat a wide variety of high-fiber foods.  Make sure that half of the grains  that you eat each day are whole grains.  Eat breads and cereals that are made with whole-grain flour instead of refined flour or white flour.  Eat brown rice, bulgur wheat, or millet instead of white rice.  Start the day with a breakfast that is high in fiber, such as a cereal that contains 5 g of fiber or more per serving.  Use beans in place of meat in soups, salads, and pasta dishes.  Eat high-fiber snacks, such as berries, raw vegetables, nuts, and popcorn.  Choose whole fruits and vegetables instead of processed forms like juice or sauce. What foods can I eat?  Fruits Berries. Pears. Apples. Oranges. Avocado. Prunes and raisins. Dried figs. Vegetables Sweet potatoes. Spinach. Kale. Artichokes. Cabbage. Broccoli. Cauliflower. Green peas. Carrots. Squash. Grains Whole-grain breads. Multigrain cereal. Oats and oatmeal. Brown rice. Barley. Bulgur wheat. [place]. Quinoa. Bran muffins. Popcorn. Rye wafer crackers. Meats and other proteins Navy, kidney, and pinto beans. Soybeans. Split peas. Lentils. Nuts and seeds. Dairy Fiber-fortified yogurt. Beverages Fiber-fortified soy milk. Fiber-fortified orange juice. Other foods Fiber bars. The items listed above may not be a complete list of recommended foods and beverages. Contact a dietitian for more options. What foods are not recommended? Fruits Fruit juice. Cooked, strained fruit. Vegetables Fried potatoes. Canned vegetables. Well-cooked vegetables. Grains White bread. Pasta made with refined flour. White rice. Meats and other proteins Fatty cuts of meat. Fried chicken or fried fish. Dairy Milk. Yogurt. Cream cheese. Sour cream. Fats and oils Butters. Beverages Soft drinks. Other foods Cakes and pastries. The items listed above may not be a complete list of foods and beverages to avoid. Contact a dietitian for more information. Summary  Fiber is a type of carbohydrate. It is found in fruits, vegetables, whole grains, and  beans.  There are many health benefits of eating a high-fiber diet, such as preventing constipation, lowering blood cholesterol, helping with weight loss, and reducing your risk of heart disease, diabetes, and certain cancers.  Gradually increase your intake of fiber. Increasing too fast can result in cramping, bloating, and gas. Drink plenty of water while you increase your fiber.  The best sources of fiber include whole fruits and vegetables, whole grains, nuts, seeds, and beans. This information is not intended to replace advice given to you by your health care provider. Make sure you discuss any questions you have with your health care provider. Document Revised: 05/14/2017 Document Reviewed: 05/14/2017 Elsevier Patient Education  2020 [employer].

## 2020-06-01 ENCOUNTER — Other Ambulatory Visit: Payer: Self-pay | Admitting: Interventional Cardiology

## 2020-06-09 DIAGNOSIS — H6990 Unspecified Eustachian tube disorder, unspecified ear: Secondary | ICD-10-CM | POA: Diagnosis not present

## 2020-06-09 DIAGNOSIS — J302 Other seasonal allergic rhinitis: Secondary | ICD-10-CM | POA: Diagnosis not present

## 2020-06-14 ENCOUNTER — Other Ambulatory Visit: Payer: Self-pay | Admitting: Interventional Cardiology

## 2020-06-14 MED ORDER — REPATHA SURECLICK 140 MG/ML ~~LOC~~ SOAJ: 1.0000 "pen " | SUBCUTANEOUS | 3 refills | Status: DC

## 2020-06-14 NOTE — Telephone Encounter (Signed)
*  STAT* If patient is at the pharmacy, call can be transferred to refill team.   1. Which medications need to be refilled? (please list name of each medication and dose if known) Evolocumab (REPATHA SURECLICK) 937 MG/ML SOAJ  2. Which pharmacy/location (including street and city if local pharmacy) is medication to be sent to? [place], [place] - [address]  3. Do they need a 30 day or 90 day supply?  90 day   Patient out of medication, is behind on injections by a week as well. Patient law saw Dr. Irish Lack on 05/31/20

## 2020-06-21 DIAGNOSIS — H903 Sensorineural hearing loss, bilateral: Secondary | ICD-10-CM | POA: Diagnosis not present

## 2020-06-21 DIAGNOSIS — H9313 Tinnitus, bilateral: Secondary | ICD-10-CM | POA: Diagnosis not present

## 2020-06-21 DIAGNOSIS — H6981 Other specified disorders of Eustachian tube, right ear: Secondary | ICD-10-CM | POA: Diagnosis not present

## 2020-06-28 ENCOUNTER — Other Ambulatory Visit: Payer: Self-pay | Admitting: Interventional Cardiology

## 2020-06-28 DIAGNOSIS — I2102 ST elevation (STEMI) myocardial infarction involving left anterior descending coronary artery: Secondary | ICD-10-CM

## 2020-08-10 DIAGNOSIS — R202 Paresthesia of skin: Secondary | ICD-10-CM | POA: Diagnosis not present

## 2020-08-10 DIAGNOSIS — C4491 Basal cell carcinoma of skin, unspecified: Secondary | ICD-10-CM | POA: Diagnosis not present

## 2020-08-10 DIAGNOSIS — Z125 Encounter for screening for malignant neoplasm of prostate: Secondary | ICD-10-CM | POA: Diagnosis not present

## 2020-08-10 DIAGNOSIS — Z0000 Encounter for general adult medical examination without abnormal findings: Secondary | ICD-10-CM | POA: Diagnosis not present

## 2020-08-10 DIAGNOSIS — E7800 Pure hypercholesterolemia, unspecified: Secondary | ICD-10-CM | POA: Diagnosis not present

## 2020-08-10 DIAGNOSIS — A6000 Herpesviral infection of urogenital system, unspecified: Secondary | ICD-10-CM | POA: Diagnosis not present

## 2020-08-10 DIAGNOSIS — J302 Other seasonal allergic rhinitis: Secondary | ICD-10-CM | POA: Diagnosis not present

## 2020-08-10 DIAGNOSIS — I2510 Atherosclerotic heart disease of native coronary artery without angina pectoris: Secondary | ICD-10-CM | POA: Diagnosis not present

## 2020-08-10 DIAGNOSIS — I10 Essential (primary) hypertension: Secondary | ICD-10-CM | POA: Diagnosis not present

## 2020-08-17 DIAGNOSIS — L570 Actinic keratosis: Secondary | ICD-10-CM | POA: Diagnosis not present

## 2020-08-17 DIAGNOSIS — L820 Inflamed seborrheic keratosis: Secondary | ICD-10-CM | POA: Diagnosis not present

## 2020-08-17 DIAGNOSIS — L821 Other seborrheic keratosis: Secondary | ICD-10-CM | POA: Diagnosis not present

## 2020-08-17 DIAGNOSIS — L814 Other melanin hyperpigmentation: Secondary | ICD-10-CM | POA: Diagnosis not present

## 2020-08-17 DIAGNOSIS — I788 Other diseases of capillaries: Secondary | ICD-10-CM | POA: Diagnosis not present

## 2020-08-17 DIAGNOSIS — L918 Other hypertrophic disorders of the skin: Secondary | ICD-10-CM | POA: Diagnosis not present

## 2020-09-22 DIAGNOSIS — H2513 Age-related nuclear cataract, bilateral: Secondary | ICD-10-CM | POA: Diagnosis not present

## 2020-10-18 DIAGNOSIS — R202 Paresthesia of skin: Secondary | ICD-10-CM | POA: Diagnosis not present

## 2020-11-09 DIAGNOSIS — J069 Acute upper respiratory infection, unspecified: Secondary | ICD-10-CM | POA: Diagnosis not present

## 2020-11-09 DIAGNOSIS — R059 Cough, unspecified: Secondary | ICD-10-CM | POA: Diagnosis not present

## 2020-11-10 DIAGNOSIS — Z03818 Encounter for observation for suspected exposure to other biological agents ruled out: Secondary | ICD-10-CM | POA: Diagnosis not present

## 2020-11-10 DIAGNOSIS — J069 Acute upper respiratory infection, unspecified: Secondary | ICD-10-CM | POA: Diagnosis not present

## 2020-12-27 ENCOUNTER — Other Ambulatory Visit: Payer: Self-pay | Admitting: Interventional Cardiology

## 2020-12-27 DIAGNOSIS — I2102 ST elevation (STEMI) myocardial infarction involving left anterior descending coronary artery: Secondary | ICD-10-CM

## 2021-02-28 DIAGNOSIS — U071 COVID-19: Secondary | ICD-10-CM | POA: Diagnosis not present

## 2021-03-10 DIAGNOSIS — U071 COVID-19: Secondary | ICD-10-CM | POA: Diagnosis not present

## 2021-03-10 DIAGNOSIS — Z20822 Contact with and (suspected) exposure to covid-19: Secondary | ICD-10-CM | POA: Diagnosis not present

## 2021-03-20 DIAGNOSIS — L255 Unspecified contact dermatitis due to plants, except food: Secondary | ICD-10-CM | POA: Diagnosis not present

## 2021-04-11 ENCOUNTER — Other Ambulatory Visit: Payer: Self-pay | Admitting: Interventional Cardiology

## 2021-05-19 DIAGNOSIS — G4700 Insomnia, unspecified: Secondary | ICD-10-CM | POA: Diagnosis not present

## 2021-05-19 DIAGNOSIS — N289 Disorder of kidney and ureter, unspecified: Secondary | ICD-10-CM | POA: Diagnosis not present

## 2021-05-19 DIAGNOSIS — M25532 Pain in left wrist: Secondary | ICD-10-CM | POA: Diagnosis not present

## 2021-06-13 ENCOUNTER — Other Ambulatory Visit: Payer: Self-pay | Admitting: Interventional Cardiology

## 2021-06-20 DIAGNOSIS — R202 Paresthesia of skin: Secondary | ICD-10-CM | POA: Diagnosis not present

## 2021-06-20 DIAGNOSIS — M797 Fibromyalgia: Secondary | ICD-10-CM | POA: Diagnosis not present

## 2021-06-20 DIAGNOSIS — I10 Essential (primary) hypertension: Secondary | ICD-10-CM | POA: Diagnosis not present

## 2021-06-20 DIAGNOSIS — M79641 Pain in right hand: Secondary | ICD-10-CM | POA: Diagnosis not present

## 2021-06-20 DIAGNOSIS — M1990 Unspecified osteoarthritis, unspecified site: Secondary | ICD-10-CM | POA: Diagnosis not present

## 2021-06-20 DIAGNOSIS — M25539 Pain in unspecified wrist: Secondary | ICD-10-CM | POA: Diagnosis not present

## 2021-06-20 DIAGNOSIS — M79642 Pain in left hand: Secondary | ICD-10-CM | POA: Diagnosis not present

## 2021-06-20 DIAGNOSIS — M549 Dorsalgia, unspecified: Secondary | ICD-10-CM | POA: Diagnosis not present

## 2021-06-21 DIAGNOSIS — H903 Sensorineural hearing loss, bilateral: Secondary | ICD-10-CM | POA: Diagnosis not present

## 2021-06-27 ENCOUNTER — Other Ambulatory Visit: Payer: Self-pay | Admitting: Interventional Cardiology

## 2021-06-27 DIAGNOSIS — I2102 ST elevation (STEMI) myocardial infarction involving left anterior descending coronary artery: Secondary | ICD-10-CM

## 2021-07-01 ENCOUNTER — Other Ambulatory Visit: Payer: Self-pay | Admitting: Interventional Cardiology

## 2021-07-01 DIAGNOSIS — M1990 Unspecified osteoarthritis, unspecified site: Secondary | ICD-10-CM | POA: Diagnosis not present

## 2021-07-01 DIAGNOSIS — I10 Essential (primary) hypertension: Secondary | ICD-10-CM | POA: Diagnosis not present

## 2021-07-01 DIAGNOSIS — M549 Dorsalgia, unspecified: Secondary | ICD-10-CM | POA: Diagnosis not present

## 2021-07-01 DIAGNOSIS — M25539 Pain in unspecified wrist: Secondary | ICD-10-CM | POA: Diagnosis not present

## 2021-07-01 DIAGNOSIS — M797 Fibromyalgia: Secondary | ICD-10-CM | POA: Diagnosis not present

## 2021-07-01 DIAGNOSIS — R202 Paresthesia of skin: Secondary | ICD-10-CM | POA: Diagnosis not present

## 2021-07-01 DIAGNOSIS — I2102 ST elevation (STEMI) myocardial infarction involving left anterior descending coronary artery: Secondary | ICD-10-CM

## 2021-07-22 NOTE — Progress Notes (Signed)
Cardiology Office Note   Date:  07/26/2021   ID:  Alex Herrera, DOB 1951-05-21, MRN [id number]  PCP:  Maury Dus, MD    Chief Complaint  Patient presents with   Follow-up   CAD  Wt Readings from Last 3 Encounters:  07/26/21 209 lb (94.8 kg)  05/31/20 202 lb 3.2 oz (91.7 kg)  06/11/19 207 lb (93.9 kg)       History of Present Illness: Alex Herrera is a 70 y.o. male  with a hx of CAD who was previously seen by Dr. Wynonia Lawman.   He has a history of CAD (remote PCI to RCA 2015, STEMI 04/2018 s/p DES to LAD x 2 with acute diastolic CHF), HTN, HLD who presents for f/u of CAD. He was previously followed by Dr. Wynonia Lawman. The patient presented to [facility] 04/26/18 with chest pain. Initial EKG nonacute, but repeat EKG with recurrent chest pain noted dramatic ST elevation and code STEMI was called. He unerwent urgent cath with results below.  His LVEDP was elevated and he was treated with IV Lasix x 1. Troponin peaked at 20. Echo 04/27/18 showed preserved LVF with an EF of 55-60% but hypokinesis of mid anteroseptal myocardium; normal diastolic parameters. He was on Livalo and Plavix prior to DC, sent home on Atorvastatin and Brilinta. 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 He has history of myalgias with Crestor. At f/u 05/13/18 BP was elevated so metoprolol was switched to carvedilol. Followup labs otherwise showed normal CBC, K 3.9, Cr 1.12, LDL 71, no recent LFTs on file.   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     Cath in 10/19 showed: Previously placed Prox RCA stent (unknown type) is widely patent. Prox LAD to Mid LAD lesion is 80% stenosed. A drug-eluting stent was successfully placed using a STENT SYNERGY DES 3X38. Post intervention, there is a 0% residual stenosis. Ost LAD to Prox LAD lesion is 99% stenosed. A drug-eluting stent was successfully placed using a STENT SYNERGY DES 3.5X8. Post intervention, there is  a 0% residual stenosis. LV end diastolic pressure is severely elevated. 39 mm Hg. There is no aortic valve stenosis.   Recommend uninterrupted dual antiplatelet therapy with Aspirin 81mg  daily and Ticagrelor 90mg  twice daily for a minimum of 12 months (ACS - Class I recommendation).    He has been on several statins in the past.  He has tried Crestor, pravastatin.  While on atorvastatin, he had similar pain. Tolerating repeatha.  Denies : exertional Chest pain. Dizziness. Leg edema. Nitroglycerin use. Orthopnea. Palpitations. Paroxysmal nocturnal dyspnea. Shortness of breath. Syncope.   Feels some indigestion.  Different than prior angina.   His activity level has decreased.  Past Medical History:  Diagnosis Date   Acute CHF (congestive heart failure) ([place])    a. presumed diastolic LVEDP 39 during cath 04/2018.   Allergic rhinitis    Arthritis    Coronary artery disease    a. PCI to RCA 2015. b. STEMI 04/2018 s/p DESx2 to LAD, EF 55-60%.   Hearing loss    History of kidney stones    Hyperlipidemia    Hypertension    Skin cancer, basal cell 2000    Past Surgical History:  Procedure Laterality Date   CARDIAC CATHETERIZATION     COLONOSCOPY     CORONARY STENT INTERVENTION N/A 04/26/2018   Procedure: CORONARY STENT INTERVENTION;  Surgeon: Jettie Booze, MD;  Location: [facility]  INVASIVE CV LAB;  Service: Cardiovascular;  Laterality: N/A;   LEFT HEART CATH AND CORONARY ANGIOGRAPHY N/A 04/26/2018   Procedure: LEFT HEART CATH AND CORONARY ANGIOGRAPHY;  Surgeon: Jettie Booze, MD;  Location: [place] CV LAB;  Service: Cardiovascular;  Laterality: N/A;   LEFT HEART CATHETERIZATION WITH CORONARY ANGIOGRAM N/A 01/22/2014   Procedure: LEFT HEART CATHETERIZATION WITH CORONARY ANGIOGRAM;  Surgeon: Jacolyn Reedy, MD;  Location: [facility] CATH LAB;  Service: Cardiovascular;  Laterality: N/A;   NASAL SEPTUM SURGERY     1970's   PERCUTANEOUS CORONARY STENT INTERVENTION (PCI-S)  01/22/2014    Procedure: PERCUTANEOUS CORONARY STENT INTERVENTION (PCI-S);  Surgeon: Jacolyn Reedy, MD;  Location: [facility] CATH LAB;  Service: Cardiovascular;;   PTCA  01/22/2014   PTCA/DES 1 PROXIMAL RCA           DR Wynonia Lawman   VASECTOMY       Current Outpatient Medications  Medication Sig Dispense Refill   Azilsartan Medoxomil 80 MG TABS Take 80 mg by mouth daily.      carvedilol (COREG) 6.25 MG tablet Take 1 tablet (6.25 mg total) by mouth 2 (two) times daily with a meal. Please keep upcoming appt. With Cardiologist in Jan. In order to receive future refills. Thank You. 180 tablet 0   Coenzyme Q10 (COQ10 PO) Take 1 capsule by mouth daily.     Evolocumab (REPATHA SURECLICK) 269 MG/ML SOAJ INJECT 1 PEN INTO THE SKIN EVERY 14 DAYS 6 mL 3   fluticasone (FLONASE) 50 MCG/ACT nasal spray Place 2 sprays into both nostrils daily as needed (seasonal allergies).      gabapentin (NEURONTIN) 100 MG capsule Take 1 capsule (100 mg total) by mouth 3 (three) times daily. 90 capsule 3   hydrochlorothiazide (MICROZIDE) 12.5 MG capsule Take 12.5 mg by mouth daily.     mometasone (ELOCON) 0.1 % cream Apply 1 application topically daily as needed (wound care).     nitroGLYCERIN (NITROSTAT) 0.4 MG SL tablet Place 1 tablet (0.4 mg total) under the tongue every 5 (five) minutes as needed for chest pain. 25 tablet 3   prasugrel (EFFIENT) 10 MG TABS tablet Take 1 tablet (10 mg total) by mouth daily. Patient needs to keep appointment in January for future refills and # 90. 30 tablet 0   valACYclovir (VALTREX) 1000 MG tablet Take 1,000 mg by mouth daily.      No current facility-administered medications for this visit.    Allergies:   Other, Penicillins, and Crestor [rosuvastatin calcium]    Social History:  The patient  reports that he has never smoked. He has never used smokeless tobacco. He reports that he does not drink alcohol and does not use drugs.   Family History:  The patient's family history includes COPD in his father;  Diabetes in his mother; Heart attack in his brother; Hypertension in his father and mother.    ROS:  Please see the history of present illness.   Otherwise, review of systems are positive for rare indigestion- never progressed, resolved with antacids.   All other systems are reviewed and negative.    PHYSICAL EXAM: VS:  BP 116/74    Pulse 74    Ht 5\' 10"  (1.778 m)    Wt 209 lb (94.8 kg)    SpO2 97%    BMI 29.99 kg/m  , BMI Body mass index is 29.99 kg/m. GEN: Well nourished, well developed, in no acute distress HEENT: normal Neck: no JVD, carotid bruits, or masses  Cardiac: RRR; no murmurs, rubs, or gallops,no edema  Respiratory:  clear to auscultation bilaterally, normal work of breathing GI: soft, nontender, nondistended, + BS MS: no deformity or atrophy Skin: warm and dry, no rash Neuro:  Strength and sensation are intact Psych: euthymic mood, full affect   EKG:   The ekg ordered today demonstrates NSR, isolated Q in III, no ST changes   Recent Labs: No results found for requested labs within last 8760 hours.   Lipid Panel    Component Value Date/Time   CHOL 93 (L) 09/10/2019 0913   TRIG 113 09/10/2019 0913   HDL 53 09/10/2019 0913   CHOLHDL 1.8 09/10/2019 0913   CHOLHDL 2.6 04/26/2018 1313   VLDL 18 04/26/2018 1313   LDLCALC 20 09/10/2019 0913     Other studies Reviewed: Additional studies/ records that were reviewed today with results demonstrating: labs reviewed.   ASSESSMENT AND PLAN:  CAD: Continue aggressive secondary prevention.  Continue clopidogrel monotherapy.  Has had some indigestion and exercise duration has decreased and some DOE with more strenuous activity.  Plan for nuclear stress test.  Hyperlipidemia: Whole food, plant-based diet. LDL 24 in Jan 2022.  Plan for repeat lipids.  Ischemic cardiomyopathy: No signs of volume overload.   Hypertensive heart disease: The current medical regimen is effective;  continue present plan and  medications.    Current medicines are reviewed at length with the patient today.  The patient concerns regarding his medicines were addressed.  The following changes have been made:  No change  Labs/ tests ordered today include:  No orders of the defined types were placed in this encounter.   Recommend 150 minutes/week of aerobic exercise Low fat, low carb, high fiber diet recommended  Disposition:   FU for stress test   Signed, Larae Grooms, MD  07/26/2021 2:00 PM    [facility] [place], [place], [place]  [address] Phone: [phone]; Fax: [phone]

## 2021-07-26 ENCOUNTER — Encounter: Payer: Self-pay | Admitting: *Deleted

## 2021-07-26 ENCOUNTER — Ambulatory Visit: Payer: Medicare PPO | Admitting: Interventional Cardiology

## 2021-07-26 ENCOUNTER — Encounter: Payer: Self-pay | Admitting: Interventional Cardiology

## 2021-07-26 ENCOUNTER — Other Ambulatory Visit: Payer: Self-pay

## 2021-07-26 VITALS — BP 116/74 | HR 74 | Ht 70.0 in | Wt 209.0 lb

## 2021-07-26 DIAGNOSIS — I25118 Atherosclerotic heart disease of native coronary artery with other forms of angina pectoris: Secondary | ICD-10-CM | POA: Diagnosis not present

## 2021-07-26 DIAGNOSIS — T466X5D Adverse effect of antihyperlipidemic and antiarteriosclerotic drugs, subsequent encounter: Secondary | ICD-10-CM | POA: Diagnosis not present

## 2021-07-26 DIAGNOSIS — E785 Hyperlipidemia, unspecified: Secondary | ICD-10-CM

## 2021-07-26 DIAGNOSIS — G720 Drug-induced myopathy: Secondary | ICD-10-CM

## 2021-07-26 DIAGNOSIS — I10 Essential (primary) hypertension: Secondary | ICD-10-CM

## 2021-07-26 DIAGNOSIS — T466X5A Adverse effect of antihyperlipidemic and antiarteriosclerotic drugs, initial encounter: Secondary | ICD-10-CM | POA: Diagnosis not present

## 2021-07-26 NOTE — Patient Instructions (Signed)
Medication Instructions:  Your physician recommends that you continue on your current medications as directed. Please refer to the Current Medication list given to you today.  *If you need a refill on your cardiac medications before your next appointment, please call your pharmacy*   Lab Work: Your physician recommends that you return for lab work I-Lipid profile.  This will be fasting  If you have labs (blood work) drawn today and your tests are completely normal, you will receive your results only by: [place] (if you have MyChart) OR A paper copy in the mail If you have any lab test that is abnormal or we need to change your treatment, we will call you to review the results.   Testing/Procedures: Your physician has requested that you have en exercise stress myoview. For further information please visit [email or web address]. Please follow instruction sheet, as given.    Follow-Up: At [facility], you and your health needs are our priority.  As part of our continuing mission to provide you with exceptional heart care, we have created designated Provider Care Teams.  These Care Teams include your primary Cardiologist (physician) and Advanced Practice Providers (APPs -  Physician Assistants and Nurse Practitioners) who all work together to provide you with the care you need, when you need it.  We recommend signing up for the patient portal called "MyChart".  Sign up information is provided on this After Visit Summary.  MyChart is used to connect with patients for Virtual Visits (Telemedicine).  Patients are able to view lab/test results, encounter notes, upcoming appointments, etc.  Non-urgent messages can be sent to your provider as well.   To learn more about what you can do with MyChart, go to [email or web address].    Your next appointment:   12 month(s)  The format for your next appointment:   In Person  Provider:   Larae Grooms, MD     Other Instructions

## 2021-07-27 ENCOUNTER — Telehealth (HOSPITAL_COMMUNITY): Payer: Self-pay | Admitting: *Deleted

## 2021-07-27 NOTE — Telephone Encounter (Signed)
Left message on voicemail per DPR in reference to upcoming appointment scheduled on 07/29/21 at 7:45 with detailed instructions given per Myocardial Perfusion Study Information Sheet for the test. LM to arrive 15 minutes early, and that it is imperative to arrive on time for appointment to keep from having the test rescheduled. If you need to cancel or reschedule your appointment, please call the office within 24 hours of your appointment. Failure to do so may result in a cancellation of your appointment, and a $50 no show fee. Phone number given for call back for any questions.

## 2021-07-29 ENCOUNTER — Ambulatory Visit (HOSPITAL_COMMUNITY): Payer: Medicare PPO | Attending: Cardiovascular Disease

## 2021-07-29 ENCOUNTER — Other Ambulatory Visit: Payer: Self-pay

## 2021-07-29 ENCOUNTER — Other Ambulatory Visit: Payer: Medicare PPO

## 2021-07-29 DIAGNOSIS — E785 Hyperlipidemia, unspecified: Secondary | ICD-10-CM

## 2021-07-29 DIAGNOSIS — G720 Drug-induced myopathy: Secondary | ICD-10-CM

## 2021-07-29 DIAGNOSIS — I25118 Atherosclerotic heart disease of native coronary artery with other forms of angina pectoris: Secondary | ICD-10-CM | POA: Diagnosis not present

## 2021-07-29 LAB — MYOCARDIAL PERFUSION IMAGING
Angina Index: 0
Duke Treadmill Score: 8
Estimated workload: 9.7
Exercise duration (min): 7 min
Exercise duration (sec): 45 s
LV dias vol: 60 mL (ref 62–150)
LV sys vol: 25 mL
MPHR: 149 {beats}/min
Nuc Stress EF: 59 %
Peak HR: 133 {beats}/min
Percent HR: 89 %
Rest HR: 58 {beats}/min
Rest Nuclear Isotope Dose: 10.5 mCi
SDS: 5
SRS: 1
SSS: 6
ST Depression (mm): 0 mm
Stress Nuclear Isotope Dose: 32.8 mCi
TID: 0.86

## 2021-07-29 MED ORDER — TECHNETIUM TC 99M TETROFOSMIN IV KIT
32.8000 | PACK | Freq: Once | INTRAVENOUS | Status: AC | PRN
  Administered 2021-07-29: 32.8 via INTRAVENOUS
  Filled 2021-07-29: qty 33

## 2021-07-29 MED ORDER — TECHNETIUM TC 99M TETROFOSMIN IV KIT
10.5000 | PACK | Freq: Once | INTRAVENOUS | Status: AC | PRN
  Administered 2021-07-29: 10.5 via INTRAVENOUS
  Filled 2021-07-29: qty 11

## 2021-08-01 ENCOUNTER — Other Ambulatory Visit: Payer: Self-pay

## 2021-08-01 DIAGNOSIS — E785 Hyperlipidemia, unspecified: Secondary | ICD-10-CM | POA: Diagnosis not present

## 2021-08-01 DIAGNOSIS — G720 Drug-induced myopathy: Secondary | ICD-10-CM | POA: Diagnosis not present

## 2021-08-01 DIAGNOSIS — T466X5A Adverse effect of antihyperlipidemic and antiarteriosclerotic drugs, initial encounter: Secondary | ICD-10-CM | POA: Diagnosis not present

## 2021-08-01 LAB — LIPID PANEL
Chol/HDL Ratio: 1.5 ratio (ref 0.0–5.0)
Cholesterol, Total: 89 mg/dL — ABNORMAL LOW (ref 100–199)
HDL: 59 mg/dL (ref >39)
LDL Chol Calc (NIH): 14 mg/dL (ref 0–99)
Triglycerides: 76 mg/dL (ref 0–149)
VLDL Cholesterol Cal: 16 mg/dL (ref 5–40)

## 2021-08-15 DIAGNOSIS — R202 Paresthesia of skin: Secondary | ICD-10-CM | POA: Diagnosis not present

## 2021-08-15 DIAGNOSIS — E7800 Pure hypercholesterolemia, unspecified: Secondary | ICD-10-CM | POA: Diagnosis not present

## 2021-08-15 DIAGNOSIS — A6000 Herpesviral infection of urogenital system, unspecified: Secondary | ICD-10-CM | POA: Diagnosis not present

## 2021-08-15 DIAGNOSIS — J302 Other seasonal allergic rhinitis: Secondary | ICD-10-CM | POA: Diagnosis not present

## 2021-08-15 DIAGNOSIS — Z0000 Encounter for general adult medical examination without abnormal findings: Secondary | ICD-10-CM | POA: Diagnosis not present

## 2021-08-15 DIAGNOSIS — I2510 Atherosclerotic heart disease of native coronary artery without angina pectoris: Secondary | ICD-10-CM | POA: Diagnosis not present

## 2021-08-15 DIAGNOSIS — F5101 Primary insomnia: Secondary | ICD-10-CM | POA: Diagnosis not present

## 2021-08-15 DIAGNOSIS — I10 Essential (primary) hypertension: Secondary | ICD-10-CM | POA: Diagnosis not present

## 2021-08-15 DIAGNOSIS — Z125 Encounter for screening for malignant neoplasm of prostate: Secondary | ICD-10-CM | POA: Diagnosis not present

## 2021-08-15 DIAGNOSIS — C4491 Basal cell carcinoma of skin, unspecified: Secondary | ICD-10-CM | POA: Diagnosis not present

## 2021-09-06 ENCOUNTER — Other Ambulatory Visit: Payer: Self-pay | Admitting: Interventional Cardiology

## 2021-09-06 DIAGNOSIS — L821 Other seborrheic keratosis: Secondary | ICD-10-CM | POA: Diagnosis not present

## 2021-09-06 DIAGNOSIS — L820 Inflamed seborrheic keratosis: Secondary | ICD-10-CM | POA: Diagnosis not present

## 2021-09-06 DIAGNOSIS — L918 Other hypertrophic disorders of the skin: Secondary | ICD-10-CM | POA: Diagnosis not present

## 2021-09-26 DIAGNOSIS — J3489 Other specified disorders of nose and nasal sinuses: Secondary | ICD-10-CM | POA: Diagnosis not present

## 2021-09-26 DIAGNOSIS — K219 Gastro-esophageal reflux disease without esophagitis: Secondary | ICD-10-CM | POA: Diagnosis not present

## 2021-09-26 DIAGNOSIS — H938X1 Other specified disorders of right ear: Secondary | ICD-10-CM | POA: Diagnosis not present

## 2021-09-26 DIAGNOSIS — H903 Sensorineural hearing loss, bilateral: Secondary | ICD-10-CM | POA: Diagnosis not present

## 2021-09-27 ENCOUNTER — Other Ambulatory Visit: Payer: Self-pay | Admitting: Interventional Cardiology

## 2021-09-27 DIAGNOSIS — I2102 ST elevation (STEMI) myocardial infarction involving left anterior descending coronary artery: Secondary | ICD-10-CM

## 2021-09-29 DIAGNOSIS — H903 Sensorineural hearing loss, bilateral: Secondary | ICD-10-CM | POA: Diagnosis not present

## 2021-10-10 DIAGNOSIS — H2513 Age-related nuclear cataract, bilateral: Secondary | ICD-10-CM | POA: Diagnosis not present

## 2021-10-27 DIAGNOSIS — D485 Neoplasm of uncertain behavior of skin: Secondary | ICD-10-CM | POA: Diagnosis not present

## 2021-10-27 DIAGNOSIS — L918 Other hypertrophic disorders of the skin: Secondary | ICD-10-CM | POA: Diagnosis not present

## 2022-02-27 DIAGNOSIS — E7800 Pure hypercholesterolemia, unspecified: Secondary | ICD-10-CM | POA: Diagnosis not present

## 2022-02-27 DIAGNOSIS — A6000 Herpesviral infection of urogenital system, unspecified: Secondary | ICD-10-CM | POA: Diagnosis not present

## 2022-02-27 DIAGNOSIS — J302 Other seasonal allergic rhinitis: Secondary | ICD-10-CM | POA: Diagnosis not present

## 2022-02-27 DIAGNOSIS — F5101 Primary insomnia: Secondary | ICD-10-CM | POA: Diagnosis not present

## 2022-02-27 DIAGNOSIS — R202 Paresthesia of skin: Secondary | ICD-10-CM | POA: Diagnosis not present

## 2022-02-27 DIAGNOSIS — G4700 Insomnia, unspecified: Secondary | ICD-10-CM | POA: Diagnosis not present

## 2022-02-27 DIAGNOSIS — C4491 Basal cell carcinoma of skin, unspecified: Secondary | ICD-10-CM | POA: Diagnosis not present

## 2022-02-27 DIAGNOSIS — I2510 Atherosclerotic heart disease of native coronary artery without angina pectoris: Secondary | ICD-10-CM | POA: Diagnosis not present

## 2022-02-27 DIAGNOSIS — I10 Essential (primary) hypertension: Secondary | ICD-10-CM | POA: Diagnosis not present

## 2022-04-10 ENCOUNTER — Other Ambulatory Visit: Payer: Self-pay | Admitting: Interventional Cardiology

## 2022-06-01 ENCOUNTER — Encounter: Payer: Self-pay | Admitting: Physician Assistant

## 2022-06-01 ENCOUNTER — Ambulatory Visit (INDEPENDENT_AMBULATORY_CARE_PROVIDER_SITE_OTHER): Payer: Medicare PPO | Admitting: Physician Assistant

## 2022-06-01 ENCOUNTER — Ambulatory Visit (INDEPENDENT_AMBULATORY_CARE_PROVIDER_SITE_OTHER): Payer: Medicare PPO

## 2022-06-01 DIAGNOSIS — M79641 Pain in right hand: Secondary | ICD-10-CM

## 2022-06-01 NOTE — Progress Notes (Signed)
Office Visit Note   Patient: Alex Herrera           Date of Birth: 1951/04/06           MRN: [id number] Visit Date: 06/01/2022              Requested by: Maury Dus, MD [address],  [place] [address] PCP: Maury Dus, MD   Assessment & Plan: Visit Diagnoses:  1. Pain in right hand     Plan: Mr. Crum is a pleasant 71 year old gentleman with a chief complaint of cramping of his last 2 fingers in his hands.  Denies any numbing and tingling.  He has also had intermittent pain when using his hand and feels weak at times with the fourth and fifth digits.  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  Denies any numbing tingling of any of the other digits.  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  He said he yesterday he was using his leaf blower and was avoiding using the fourth and fifth digits and for some reason now he is not having any pain.  Was aggravated recently though with lifting luggage  Follow-Up Instructions: Return if symptoms worsen or fail to improve.   Orders:  Orders Placed This Encounter  Procedures   XR Hand Complete Right   Ambulatory referral to Occupational Therapy   No orders of the defined types were placed in this encounter.     Procedures: No procedures performed   Clinical Data: No additional findings.   Subjective: Chief Complaint  Patient presents with   Right Hand - Pain    HPI pleasant active 71 year old gentleman in presents today with a chief complaint of fourth and fifth digit cramping in his right hand.  Denies any elbow pain denies any paresthesias denies any injuries  Review of Systems  All other systems reviewed and are negative.    Objective: Vital Signs: There were no vitals taken for this visit.  Physical Exam Constitutional:      Appearance: Normal appearance.  Skin:    General: Skin is warm and dry.  Neurological:      Mental Status: He is alert.     Ortho Exam Examination of his right hand and wrist he has no swelling no atrophy no erythema.  He has a palpable radial and ulnar pulse.  Sensation is intact in all of his digits brisk capillary refill.  Good strength with resisted abduction abduction.  He does have stiffness in his wrist with extension and flexion.  No reproducible symptoms with resisted pronation and supination no tenderness over the medial lateral epicondyles Specialty Comments:  No specialty comments available.  Imaging: XR Hand Complete Right  Result Date: 06/01/2022 Radiographs of his right hand were obtained today.  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 with loss of joint space and sclerotic changes both in the first [facility] joint as well as the radiocarpal joint    PMFS History: Patient Active Problem List   Diagnosis Date Noted   Pain in right hand 06/01/2022   Statin myopathy 06/11/2019   Essential hypertension 04/28/2018   Dyslipidemia, goal LDL below 70 01/22/2014   STEMI involving left anterior descending coronary artery ([place]) 01/22/2014   CAD S/P PCI 01/22/2014   Presence of drug coated stent in right coronary artery 01/22/2014   Obesity (BMI 30-39.9)    Abnormal stress test    Past  Medical History:  Diagnosis Date   Acute CHF (congestive heart failure) ([place])    a. presumed diastolic LVEDP 39 during cath 04/2018.   Allergic rhinitis    Arthritis    Coronary artery disease    a. PCI to RCA 2015. b. STEMI 04/2018 s/p DESx2 to LAD, EF 55-60%.   Hearing loss    History of kidney stones    Hyperlipidemia    Hypertension    Skin cancer, basal cell 2000    Family History  Problem Relation Age of Onset   Hypertension Father        Deceased   COPD Father    Hypertension Mother    Diabetes Mother    Heart attack Brother     Past Surgical History:  Procedure Laterality Date   CARDIAC CATHETERIZATION     COLONOSCOPY     CORONARY STENT INTERVENTION N/A  04/26/2018   Procedure: CORONARY STENT INTERVENTION;  Surgeon: Jettie Booze, MD;  Location: [place] CV LAB;  Service: Cardiovascular;  Laterality: N/A;   LEFT HEART CATH AND CORONARY ANGIOGRAPHY N/A 04/26/2018   Procedure: LEFT HEART CATH AND CORONARY ANGIOGRAPHY;  Surgeon: Jettie Booze, MD;  Location: [place] CV LAB;  Service: Cardiovascular;  Laterality: N/A;   LEFT HEART CATHETERIZATION WITH CORONARY ANGIOGRAM N/A 01/22/2014   Procedure: LEFT HEART CATHETERIZATION WITH CORONARY ANGIOGRAM;  Surgeon: Jacolyn Reedy, MD;  Location: [facility] CATH LAB;  Service: Cardiovascular;  Laterality: N/A;   NASAL SEPTUM SURGERY     1970's   PERCUTANEOUS CORONARY STENT INTERVENTION (PCI-S)  01/22/2014   Procedure: PERCUTANEOUS CORONARY STENT INTERVENTION (PCI-S);  Surgeon: Jacolyn Reedy, MD;  Location: [facility] CATH LAB;  Service: Cardiovascular;;   PTCA  01/22/2014   PTCA/DES 1 PROXIMAL RCA           DR Wynonia Lawman   VASECTOMY     Social History   Occupational History   Occupation: Self Employed  Tobacco Use   Smoking status: Never   Smokeless tobacco: Never  Vaping Use   Vaping Use: Never used  Substance and Sexual Activity   Alcohol use: No    Alcohol/week: 0.0 standard drinks of alcohol    Comment: social   Drug use: No   Sexual activity: Not on file

## 2022-06-02 DIAGNOSIS — R6883 Chills (without fever): Secondary | ICD-10-CM | POA: Diagnosis not present

## 2022-06-02 DIAGNOSIS — R051 Acute cough: Secondary | ICD-10-CM | POA: Diagnosis not present

## 2022-06-02 DIAGNOSIS — J208 Acute bronchitis due to other specified organisms: Secondary | ICD-10-CM | POA: Diagnosis not present

## 2022-06-02 DIAGNOSIS — Z03818 Encounter for observation for suspected exposure to other biological agents ruled out: Secondary | ICD-10-CM | POA: Diagnosis not present

## 2022-06-02 DIAGNOSIS — R5383 Other fatigue: Secondary | ICD-10-CM | POA: Diagnosis not present

## 2022-06-09 DIAGNOSIS — J208 Acute bronchitis due to other specified organisms: Secondary | ICD-10-CM | POA: Diagnosis not present

## 2022-06-12 ENCOUNTER — Other Ambulatory Visit: Payer: Self-pay

## 2022-06-12 ENCOUNTER — Encounter: Payer: Self-pay | Admitting: Rehabilitative and Restorative Service Providers"

## 2022-06-12 ENCOUNTER — Ambulatory Visit: Payer: Medicare PPO | Admitting: Rehabilitative and Restorative Service Providers"

## 2022-06-12 DIAGNOSIS — M25631 Stiffness of right wrist, not elsewhere classified: Secondary | ICD-10-CM | POA: Diagnosis not present

## 2022-06-12 DIAGNOSIS — M25531 Pain in right wrist: Secondary | ICD-10-CM | POA: Diagnosis not present

## 2022-06-12 DIAGNOSIS — M25541 Pain in joints of right hand: Secondary | ICD-10-CM | POA: Diagnosis not present

## 2022-06-12 NOTE — Therapy (Signed)
OUTPATIENT OCCUPATIONAL THERAPY ORTHO EVALUATION  Patient Name: Alex Herrera MRN: [id number] DOB:May 17, 1951, 71 y.o., male Today's Date: 06/12/2022  PCP: Maury Dus, MD REFERRING PROVIDER:  Persons, Bevely Palmer, [place]    END OF SESSION:  OT End of Session - 06/12/22 0932     Visit Number 1    Number of Visits 4    Date for OT Re-Evaluation 07/14/22    Authorization Type [employer]    OT Start Time 0932    OT Stop Time 1030    OT Time Calculation (min) 58 min    Activity Tolerance Patient tolerated treatment well;No increased pain;Patient limited by fatigue;Patient limited by pain    Behavior During Therapy [facility] for tasks assessed/performed             Past Medical History:  Diagnosis Date   Acute CHF (congestive heart failure) ([place])    a. presumed diastolic LVEDP 39 during cath 04/2018.   Allergic rhinitis    Arthritis    Coronary artery disease    a. PCI to RCA 2015. b. STEMI 04/2018 s/p DESx2 to LAD, EF 55-60%.   Hearing loss    History of kidney stones    Hyperlipidemia    Hypertension    Skin cancer, basal cell 2000   Past Surgical History:  Procedure Laterality Date   CARDIAC CATHETERIZATION     COLONOSCOPY     CORONARY STENT INTERVENTION N/A 04/26/2018   Procedure: CORONARY STENT INTERVENTION;  Surgeon: Jettie Booze, MD;  Location: [place] [facility];  Service: Cardiovascular;  Laterality: N/A;   LEFT HEART CATH AND CORONARY ANGIOGRAPHY N/A 04/26/2018   Procedure: LEFT HEART CATH AND CORONARY ANGIOGRAPHY;  Surgeon: Jettie Booze, MD;  Location: [place] [facility];  Service: Cardiovascular;  Laterality: N/A;   LEFT HEART CATHETERIZATION WITH CORONARY ANGIOGRAM N/A 01/22/2014   Procedure: LEFT HEART CATHETERIZATION WITH CORONARY ANGIOGRAM;  Surgeon: Jacolyn Reedy, MD;  Location: [facility] CATH LAB;  Service: Cardiovascular;  Laterality: N/A;   NASAL SEPTUM SURGERY     1970's   PERCUTANEOUS CORONARY STENT INTERVENTION (PCI-S)  01/22/2014    Procedure: PERCUTANEOUS CORONARY STENT INTERVENTION (PCI-S);  Surgeon: Jacolyn Reedy, MD;  Location: [facility] CATH LAB;  Service: Cardiovascular;;   PTCA  01/22/2014   PTCA/DES 1 PROXIMAL RCA           DR Wynonia Lawman   VASECTOMY     Patient Active Problem List   Diagnosis Date Noted   Pain in right hand 06/01/2022   Statin myopathy 06/11/2019   Essential hypertension 04/28/2018   Dyslipidemia, goal LDL below 70 01/22/2014   STEMI involving left anterior descending coronary artery ([place]) 01/22/2014   CAD S/P PCI 01/22/2014   Presence of drug coated stent in right coronary artery 01/22/2014   Obesity (BMI 30-39.9)    Abnormal stress test     ONSET DATE: ~ 2 years ago insidious onset   REFERRING DIAG: M79.641 (ICD-10-CM) - Pain in right hand   THERAPY DIAG:  Pain in right wrist  Stiffness of right wrist, not elsewhere classified  Pain in joint of right hand  Rationale for Evaluation and Treatment: Rehabilitation  SUBJECTIVE:   SUBJECTIVE STATEMENT: He runs an antique booth, he has a delivery service, installs sound systems, and is in a band. He states when using hedge trimmers and electric blower, his Rt hand hurts at ulnar/lateral/dorsal aspect near base of digits 4,5.    PERTINENT HISTORY: Per provider notes: "chief complaint of cramping  of his last 2 fingers in his hands.  Denies any numbing and tingling.  He has also had intermittent pain when using his hand and feels weak at times with the fourth and fifth digits.  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  Denies any numbing tingling of any of the other digits.  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  He said he yesterday he was using his leaf blower and was avoiding using the fourth and fifth digits and for some reason now he is not having any pain.  Was aggravated recently though with lifting luggage "  PRECAUTIONS: None  WEIGHT BEARING RESTRICTIONS: No  PAIN:  Are  you having pain? None now at rest Rating: 0/10 at rest now, it goes up to 9/10 with trimming hedges.    FALLS: Has patient fallen in last 6 months? No  LIVING ENVIRONMENT: Lives with: lives with their spouse   PLOF: Independent  PATIENT GOALS: To find out why his right hand and wrist hurts at times.  And to have better function  NEXT MD VISIT:   OBJECTIVE: (All objective assessments below are from initial evaluation on: 06/12/22 unless otherwise specified.)    HAND DOMINANCE: Right   ADLs: Overall ADLs: States decreased ability to do higher-level IADLs like waxing car, lifting speakers, raking, cutting hedges.    FUNCTIONAL OUTCOME MEASURES: Eval: Patient Specific Functional Scale: 4.8 (raking, lifting equipment, waxing/scrubbing)  (Higher Score  =  Better Ability for the Selected Tasks)     UPPER EXTREMITY ROM     Shoulder to Wrist AROM Right eval Left eval  Forearm supination 68 85  Forearm pronation  87 80  Wrist flexion 40 45  Wrist extension 33 54  Wrist ulnar deviation    Wrist radial deviation    Functional dart thrower's motion (F-DTM) in ulnar flexion    F-DTM in radial extension     (Blank rows = not tested)   Hand AROM Right eval Left eval  Full Fist Ability (or Gap to Distal Palmar Crease) yes yes  Thumb Opposition to Small Finger (or Gap) yes yes  Thumb MCP (0-60) 42 53  Thumb IP (0-80) 50 52  Thumb Radial abd/add (0-55)     Thumb Palmar abd/add (0-45) 4.5cm spread 5cm spread   (Blank rows = not tested)   UPPER EXTREMITY MMT:     MMT Right TBD  Forearm supination   Forearm pronation   Wrist flexion 5/5  Wrist extension 5/5  Wrist ulnar deviation 5/5  Wrist radial deviation   (Blank rows = not tested)  HAND FUNCTION: Eval: No observed weakness in affected hand.    COORDINATION: Eval: No significant observed coordination impairments with affected hand.  SENSATION: Eval:  Light touch intact today  EDEMA:   Eval: Mildly swollen  in Rt CMC J area and dorsally over bse of SF, RF metacarpals  COGNITION: Eval: Overall cognitive status: WFL for evaluation today   OBSERVATIONS:   Eval: Negative TFCC shear test.  No instability at DRUJ.  Right thumb is overtly arthritic tender to touch CMC joint positive grind test.  No overt tenderness over dorsal small and ring finger, no tenderness or any finger joints, triquetrum does grind against hamate when pressure applied.  This is repeatable consistently, and irritating Lee painful to him.  His left triquetrum does not grind in this way.  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  Due to good strength testing, and nature of complaints of pain, it does not seem that he has any tendinitis or soft tissue damage other than tightness.   TODAY'S TREATMENT:  Post-evaluation treatment: OT educates to avoid prolonged or painful postures and ulnar deviation, OT also supplies, custom compressive wrist strap that he was educated to wear during functional activities that hurt him.  He was also educated on initial home exercises as below to increase the joint space and decrease pain and stiffness in right wrist ulnarly.  He demonstrates some back well without any pain.  Exercises - Forearm Supination Stretch  - 3-4 x daily - 3-5 reps - 15 sec hold - Wrist Flexion Stretch  - 4 x daily - 3-5 reps - 15 sec hold - Wrist Prayer Stretch  - 4 x daily - 3-5 reps - 15 sec hold   PATIENT EDUCATION: Education details: See tx section above for details  Person educated: Patient Education method: Verbal Instruction, Teach back, Handouts  Education comprehension: States and demonstrates understanding, Additional Education required    HOME EXERCISE PROGRAM: Access Code: [email or web address] URL: [email or web address] Date: 06/12/2022 Prepared by: Benito Mccreedy   GOALS: Goals reviewed with patient? Yes   SHORT TERM GOALS: (STG required if POC>30 days) Target  Date: 06/30/22  Pt will obtain protective, custom orthotic. Goal status: MET- wrist strap  2.  Pt will demo/state understanding of initial HEP to improve pain levels and prerequisite motion. Goal status: Progressing   LONG TERM GOALS: Target Date: 07/14/22  Pt will improve functional ability by decreased impairment per PSFS assessment from 4.8 to 7 or better, for better quality of life. Goal status: INITIAL  2.  Pt will improve A/ROM in Rt wrist flex/ ext  from 40/33 to at least 45/50, to have functional motion for tasks like reach and grasp.  Goal status: INITIAL  3.  Pt will improve AROM in Rt thumb MCP J flexion from42* to at least 50* to have increased functional ability to carry out selfcare and higher-level homecare tasks with no difficulty. Goal status: INITIAL  4.  Pt will decrease pain at worst from 9/10 with difficult tasks (IADLs) to 3-4/10 or better to have better sleep and occupational participation in daily roles. Goal status: INITIAL   ASSESSMENT:  CLINICAL IMPRESSION: Patient is a 71 y.o. male who was seen today for occupational therapy evaluation for Rt wrist and thumb tightness and pain.  He will benefit from occupational therapy to increase functional ability.   PERFORMANCE DEFICITS: in functional skills including IADLs, ROM, strength, pain, fascial restrictions, flexibility, Gross motor control, body mechanics, endurance, decreased knowledge of precautions, and UE functional use, cognitive skills including problem solving, and psychosocial skills including coping strategies, habits, and routines and behaviors.   IMPAIRMENTS: are limiting patient from IADLs, work, and leisure.   COMORBIDITIES: may have co-morbidities  that affects occupational performance. Patient will benefit from skilled OT to address above impairments and improve overall function.  MODIFICATION OR ASSISTANCE TO COMPLETE EVALUATION: No modification of tasks or assist necessary to complete an  evaluation.  OT OCCUPATIONAL PROFILE AND HISTORY: Problem focused assessment: Including review of records relating to presenting problem.  CLINICAL DECISION MAKING: LOW - limited treatment options, no task modification necessary  REHAB POTENTIAL: Excellent  EVALUATION COMPLEXITY: Low      PLAN:  OT FREQUENCY: every other week  OT DURATION: 4 weeks  PLANNED INTERVENTIONS: self care/ADL training, therapeutic exercise, therapeutic activity, manual therapy,  passive range of motion, splinting, paraffin, compression bandaging, moist heat, cryotherapy, contrast bath, patient/family education, energy conservation, and coping strategies training  RECOMMENDED OTHER SERVICES: none now   CONSULTED AND AGREED WITH PLAN OF CARE: Patient  PLAN FOR NEXT SESSION: give out thumb [facility] J program    Benito Mccreedy, OTR/L, CHT 06/12/2022, 10:46 AM   Referring diagnosis? M79.641  Treatment diagnosis? (if different than referring diagnosis) M25.531, M25.631 What was this (referring dx) caused by? _0  Surgery _1  Fall _2  Ongoing issue _3  Arthritis _4  Other: ____________  Laterality: _5  Rt _6  Lt _7  Both  Check all possible CPT codes:  *CHOOSE 10 OR LESS*    _8  97110 (Therapeutic Exercise)  _9  92507 (SLP Treatment)  _10  47425 (Neuro Re-ed)   _11  95638 (Swallowing Treatment)   _12  75643 (Gait Training)   _13  32951 (Cognitive Training, 1st 15 minutes) _14  97140 (Manual Therapy)   _15  97130 (Cognitive Training, each add'l 15 minutes)  _16  97164 (Re-evaluation)                              _17  Other, List CPT Code ____________  _18  88416 (Therapeutic Activities)     _19  60630 (Self Care)   _20  All codes above (97110 - 97535)  _21  97012 (Mechanical Traction)  _22  97014 (E-stim Unattended)  _23  97032 (E-stim manual)  _24  97033 (Ionto)  _25  97035 (Ultrasound) _26  97750 (Physical Performance Training) _27  16010 (Aquatic Therapy) _28  97016 (Vasopneumatic Device) _29  93235 (Paraffin) _30  57322 (Contrast  Bath) _31  97597 (Wound Care 1st 20 sq cm) _32  97598 (Wound Care each add'l 20 sq cm) _33  97760 (Orthotic Fabrication, Fitting, Training Initial) _34  [id number] (Prosthetic Management and Training Initial) _35  [id number] (Orthotic or Prosthetic Training/ Modification Subsequent)

## 2022-06-17 DIAGNOSIS — R062 Wheezing: Secondary | ICD-10-CM | POA: Diagnosis not present

## 2022-06-17 DIAGNOSIS — J209 Acute bronchitis, unspecified: Secondary | ICD-10-CM | POA: Diagnosis not present

## 2022-06-19 DIAGNOSIS — R062 Wheezing: Secondary | ICD-10-CM | POA: Diagnosis not present

## 2022-06-19 DIAGNOSIS — J40 Bronchitis, not specified as acute or chronic: Secondary | ICD-10-CM | POA: Diagnosis not present

## 2022-06-26 NOTE — Therapy (Signed)
OUTPATIENT OCCUPATIONAL THERAPY TREATMENT NOTE  Patient Name: Alex Herrera MRN: [id number] DOB:01/18/1951, 71 y.o., male Today's Date: 06/26/2022  PCP: Reade, Robert, MD REFERRING PROVIDER:  Persons, Mary Anne, PA    END OF SESSION:    Past Medical History:  Diagnosis Date   Acute CHF (congestive heart failure) ([facility])    a. presumed diastolic LVEDP 39 during cath 04/2018.   Allergic rhinitis    Arthritis    Coronary artery disease    a. PCI to RCA 2015. b. STEMI 04/2018 s/p DESx2 to LAD, EF 55-60%.   Hearing loss    History of kidney stones    Hyperlipidemia    Hypertension    Skin cancer, basal cell 2000   Past Surgical History:  Procedure Laterality Date   CARDIAC CATHETERIZATION     COLONOSCOPY     CORONARY STENT INTERVENTION N/A 04/26/2018   Procedure: CORONARY STENT INTERVENTION;  Surgeon: Varanasi, Jayadeep S, MD;  Location: [facility];  Service: Cardiovascular;  Laterality: N/A;   LEFT HEART CATH AND CORONARY ANGIOGRAPHY N/A 04/26/2018   Procedure: LEFT HEART CATH AND CORONARY ANGIOGRAPHY;  Surgeon: Varanasi, Jayadeep S, MD;  Location: [facility];  Service: Cardiovascular;  Laterality: N/A;   LEFT HEART CATHETERIZATION WITH CORONARY ANGIOGRAM N/A 01/22/2014   Procedure: LEFT HEART CATHETERIZATION WITH CORONARY ANGIOGRAM;  Surgeon: William S Tilley, MD;  Location: [facility] CATH LAB;  Service: Cardiovascular;  Laterality: N/A;   NASAL SEPTUM SURGERY     1970's   PERCUTANEOUS CORONARY STENT INTERVENTION (PCI-S)  01/22/2014   Procedure: PERCUTANEOUS CORONARY STENT INTERVENTION (PCI-S);  Surgeon: William S Tilley, MD;  Location: [facility] CATH LAB;  Service: Cardiovascular;;   PTCA  01/22/2014   PTCA/DES 1 PROXIMAL RCA           DR TILLEY   VASECTOMY     Patient Active Problem List   Diagnosis Date Noted   Pain in right hand 06/01/2022   Statin myopathy 06/11/2019   Essential hypertension 04/28/2018   Dyslipidemia, goal LDL below 70 01/22/2014   STEMI involving  left anterior descending coronary artery ([facility]) 01/22/2014   CAD S/P PCI 01/22/2014   Presence of drug coated stent in right coronary artery 01/22/2014   Obesity (BMI 30-39.9)    Abnormal stress test     ONSET DATE: ~ 2 years ago insidious onset   REFERRING DIAG: M79.641 (ICD-10-CM) - Pain in right hand   THERAPY DIAG:  No diagnosis found.  Rationale for Evaluation and Treatment: Rehabilitation  PERTINENT HISTORY: Per provider notes: "chief complaint of cramping of his last 2 fingers in his hands.  Denies any numbing and tingling.  He has also had intermittent pain when using his hand and feels weak at times with the fourth and fifth digits.  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  Denies any numbing tingling of any of the other digits.  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  He said he yesterday he was using his leaf blower and was avoiding using the fourth and fifth digits and for some reason now he is not having any pain.  Was aggravated recently though with lifting luggage "  Per OT eval: "He runs an antique booth, he has a delivery service, installs sound systems, and is in a band. He states when using hedge trimmers and electric blower, his Rt hand hurts at ulnar/lateral/dorsal aspect near base of digits 4,5. "     PRECAUTIONS: None   WEIGHT BEARING RESTRICTIONS: No   SUBJECTIVE:   SUBJECTIVE STATEMENT: He states ***    PAIN:  Are you having pain? *** None now at rest Rating: 0/10 at rest now, it goes up to 9/10 with trimming hedges.   PATIENT GOALS: To find out why his right hand and wrist hurts at times.  And to have better function   OBJECTIVE: (All objective assessments below are from initial evaluation on: 06/12/22 unless otherwise specified.)    HAND DOMINANCE: Right   ADLs: Overall ADLs: States decreased ability to do higher-level IADLs like waxing car, lifting speakers, raking, cutting hedges.     FUNCTIONAL OUTCOME MEASURES: Eval: Patient Specific Functional Scale: 4.8 (raking, lifting equipment, waxing/scrubbing)  (Higher Score  =  Better Ability for the Selected Tasks)     UPPER EXTREMITY ROM     Shoulder to Wrist AROM Right eval Left eval Right 06/27/22  Forearm supination 68 85   Forearm pronation  87 80   Wrist flexion 40 45 ***  Wrist extension 33 54 ***  Wrist ulnar deviation     Wrist radial deviation     Functional dart thrower's motion (F-DTM) in ulnar flexion     F-DTM in radial extension      (Blank rows = not tested)   Hand AROM Right eval Left eval  Full Fist Ability (or Gap to Distal Palmar Crease) yes yes  Thumb Opposition to Small Finger (or Gap) yes yes  Thumb MCP (0-60) 42 53  Thumb IP (0-80) 50 52  Thumb Radial abd/add (0-55)     Thumb Palmar abd/add (0-45) 4.5cm spread 5cm spread   (Blank rows = not tested)   UPPER EXTREMITY MMT:     MMT Right TBD  Forearm supination   Forearm pronation   Wrist flexion 5/5  Wrist extension 5/5  Wrist ulnar deviation 5/5  Wrist radial deviation   (Blank rows = not tested)  HAND FUNCTION: Eval: No observed weakness or coordination issues in affected hands.   EDEMA:   Eval: Mildly swollen in Rt CMC J area and dorsally over bse of SF, RF metacarpals  OBSERVATIONS:   Eval: Negative TFCC shear test.  No instability at DRUJ.  Right thumb is overtly arthritic tender to touch CMC joint positive grind test.  No overt tenderness over dorsal small and ring finger, no tenderness or any finger joints, triquetrum does grind against hamate when pressure applied.  This is repeatable consistently, and irritating, painful to him.  His left triquetrum does not grind in this way.  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  Due to good strength testing, and nature of complaints of pain, it does not seem that he has any tendinitis or soft tissue damage other than tightness.   TODAY'S  TREATMENT:  06/27/22: ***OT reviews his initial HEP for wrist and FA stretches, and he's doing well with these. New motion check shows ***.  OT then educates him on "Stable-C" thumb OA management plan with the following exercises. Additionally, he is reminded to try to avoid prolonged forceful grip or repetitive gripping, etc.   Exercises - Forearm Supination Stretch  - 3-4 x daily - 3-5 reps - 15 sec hold - Wrist Flexion Stretch  - 4 x daily - 3-5 reps - 15 sec hold - Wrist Prayer Stretch  - 4 x daily - 3-5 reps - 15 sec hold - Stretch thumb downward   -   2-3 x daily - 3-5 reps - 15 sec hold - Stretch Thumb into "C-Shape" (don't use other thumb)  - 2-3 x daily - 3-5 reps - 15 sec hold - Towel Roll Grip with Forearm in Neutral  - 2-3 x daily - 3-5 reps - 10 sec hold - Resisted Finger Abduction - Index and Middle  - 2-3 x daily - 5-10 reps - 2-3 sec hold - C-Strength (try using rubber band)   - 2-3 x daily - 5-10 reps - 2-3 sec hold    Post-evaluation treatment: OT educates to avoid prolonged or painful postures and ulnar deviation, OT also supplies, custom compressive wrist strap that he was educated to wear during functional activities that hurt him.  He was also educated on initial home exercises as below to increase the joint space and decrease pain and stiffness in right wrist ulnarly.  He demonstrates some back well without any pain.  Exercises - Forearm Supination Stretch  - 3-4 x daily - 3-5 reps - 15 sec hold - Wrist Flexion Stretch  - 4 x daily - 3-5 reps - 15 sec hold - Wrist Prayer Stretch  - 4 x daily - 3-5 reps - 15 sec hold   PATIENT EDUCATION: Education details: See tx section above for details  Person educated: Patient Education method: Verbal Instruction, Teach back, Handouts  Education comprehension: States and demonstrates understanding, Additional Education required    HOME EXERCISE PROGRAM: Access Code: [email or web address] URL: [email or web address] Date:  06/12/2022 Prepared by: Nathanael Moore   GOALS: Goals reviewed with patient? Yes   SHORT TERM GOALS: (STG required if POC>30 days) Target Date: 06/30/22  Pt will obtain protective, custom orthotic. Goal status: MET- wrist strap  2.  Pt will demo/state understanding of initial HEP to improve pain levels and prerequisite motion. Goal status: MET   LONG TERM GOALS: Target Date: 07/14/22  Pt will improve functional ability by decreased impairment per PSFS assessment from 4.8 to 7 or better, for better quality of life. Goal status: INITIAL  2.  Pt will improve A/ROM in Rt wrist flex/ ext  from 40/33 to at least 45/50, to have functional motion for tasks like reach and grasp.  Goal status: INITIAL  3.  Pt will improve AROM in Rt thumb MCP J flexion from42* to at least 50* to have increased functional ability to carry out selfcare and higher-level homecare tasks with no difficulty. Goal status: INITIAL  4.  Pt will decrease pain at worst from 9/10 with difficult tasks (IADLs) to 3-4/10 or better to have better sleep and occupational participation in daily roles. Goal status: INITIAL   ASSESSMENT:  CLINICAL IMPRESSION: 06/27/22: ***  Eval: Patient is a 71 y.o. male who was seen today for occupational therapy evaluation for Rt wrist and thumb tightness and pain.  He will benefit from occupational therapy to increase functional ability.    PLAN:  OT FREQUENCY: every other week  OT DURATION: 4 weeks  PLANNED INTERVENTIONS: self care/ADL training, therapeutic exercise, therapeutic activity, manual therapy, passive range of motion, splinting, paraffin, compression bandaging, moist heat, cryotherapy, contrast bath, patient/family education, energy conservation, and coping strategies training  RECOMMENDED OTHER SERVICES: none now   CONSULTED AND AGREED WITH PLAN OF CARE: Patient  PLAN FOR NEXT SESSION:  ***  Nathanael Moore, OTR/L, CHT 06/26/2022, 8:36 AM   

## 2022-06-27 ENCOUNTER — Ambulatory Visit (INDEPENDENT_AMBULATORY_CARE_PROVIDER_SITE_OTHER): Payer: Medicare PPO | Admitting: Rehabilitative and Restorative Service Providers"

## 2022-06-27 ENCOUNTER — Encounter: Payer: Self-pay | Admitting: Rehabilitative and Restorative Service Providers"

## 2022-06-27 DIAGNOSIS — M25531 Pain in right wrist: Secondary | ICD-10-CM | POA: Diagnosis not present

## 2022-06-27 DIAGNOSIS — M25541 Pain in joints of right hand: Secondary | ICD-10-CM

## 2022-06-27 DIAGNOSIS — M25631 Stiffness of right wrist, not elsewhere classified: Secondary | ICD-10-CM

## 2022-07-31 NOTE — Therapy (Signed)
OUTPATIENT OCCUPATIONAL THERAPY TREATMENT & DISCHARGE NOTE  Patient Name: Alex Herrera MRN: [id number] DOB:30-Aug-1950, 72 y.o., male Today's Date: 08/01/2022  PCP: Maury Dus, MD REFERRING PROVIDER:  Persons, Bevely Palmer, [place]    END OF SESSION:  OT End of Session - 08/01/22 1015     Visit Number 3    Number of Visits 4    Date for OT Re-Evaluation 08/04/22    Authorization Type [employer]    Progress Note Due on Visit 10    OT Start Time 1015    OT Stop Time 1057    OT Time Calculation (min) 42 min    Activity Tolerance Patient tolerated treatment well;No increased pain    Behavior During Therapy WFL for tasks assessed/performed             Past Medical History:  Diagnosis Date   Acute CHF (congestive heart failure) ([place])    a. presumed diastolic LVEDP 39 during cath 04/2018.   Allergic rhinitis    Arthritis    Coronary artery disease    a. PCI to RCA 2015. b. STEMI 04/2018 s/p DESx2 to LAD, EF 55-60%.   Hearing loss    History of kidney stones    Hyperlipidemia    Hypertension    Skin cancer, basal cell 2000   Past Surgical History:  Procedure Laterality Date   CARDIAC CATHETERIZATION     COLONOSCOPY     CORONARY STENT INTERVENTION N/A 04/26/2018   Procedure: CORONARY STENT INTERVENTION;  Surgeon: Jettie Booze, MD;  Location: [facility];  Service: Cardiovascular;  Laterality: N/A;   LEFT HEART CATH AND CORONARY ANGIOGRAPHY N/A 04/26/2018   Procedure: LEFT HEART CATH AND CORONARY ANGIOGRAPHY;  Surgeon: Jettie Booze, MD;  Location: [facility];  Service: Cardiovascular;  Laterality: N/A;   LEFT HEART CATHETERIZATION WITH CORONARY ANGIOGRAM N/A 01/22/2014   Procedure: LEFT HEART CATHETERIZATION WITH CORONARY ANGIOGRAM;  Surgeon: Jacolyn Reedy, MD;  Location: [facility] CATH LAB;  Service: Cardiovascular;  Laterality: N/A;   NASAL SEPTUM SURGERY     1970's   PERCUTANEOUS CORONARY STENT INTERVENTION (PCI-S)  01/22/2014   Procedure:  PERCUTANEOUS CORONARY STENT INTERVENTION (PCI-S);  Surgeon: Jacolyn Reedy, MD;  Location: [facility] CATH LAB;  Service: Cardiovascular;;   PTCA  01/22/2014   PTCA/DES 1 PROXIMAL RCA           DR Wynonia Lawman   VASECTOMY     Patient Active Problem List   Diagnosis Date Noted   Pain in right hand 06/01/2022   Statin myopathy 06/11/2019   Essential hypertension 04/28/2018   Dyslipidemia, goal LDL below 70 01/22/2014   STEMI involving left anterior descending coronary artery ([place]) 01/22/2014   CAD S/P PCI 01/22/2014   Presence of drug coated stent in right coronary artery 01/22/2014   Obesity (BMI 30-39.9)    Abnormal stress test     ONSET DATE: ~ 2 years ago insidious onset   REFERRING DIAG: M79.641 (ICD-10-CM) - Pain in right hand   THERAPY DIAG:  Pain in right wrist  Stiffness of right wrist, not elsewhere classified  Pain in joint of right hand  Rationale for Evaluation and Treatment: Rehabilitation  PERTINENT HISTORY: Per provider notes: "chief complaint of cramping of his last 2 fingers in his hands.  Denies any numbing and tingling.  He has also had intermittent pain when using his hand and feels weak at times with the fourth and fifth digits.  Denies any injury but knows  he has arthritis in his wrist.  Denies any numbing tingling of any of the other digits.  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  He said he yesterday he was using his leaf blower and was avoiding using the fourth and fifth digits and for some reason now he is not having any pain.  Was aggravated recently though with lifting luggage "  Per OT eval: "He runs an antique booth, he has a delivery service, installs sound systems, and is in a band. He states when using hedge trimmers and electric blower, his Rt hand hurts at ulnar/lateral/dorsal aspect near base of digits 4,5. "   PRECAUTIONS: None   WEIGHT BEARING RESTRICTIONS: No   SUBJECTIVE:   SUBJECTIVE STATEMENT: He  states managing pretty well and only experienced significant pain once in the past month. "I really can't complain, and I have been very active."    PAIN:  Are you having pain?  Yes- with the rainy weather, but "nothing really"  Rating: 3/10 at rest now, and up to 3/10 at worst in past week  PATIENT GOALS: To find out why his right hand and wrist hurts at times.  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    FUNCTIONAL OUTCOME MEASURES: 08/01/22: PSFS 8 today  Eval: Patient Specific Functional Scale: 4.8 (raking, lifting equipment, waxing/scrubbing)  (Higher Score  =  Better Ability for the Selected Tasks)     UPPER EXTREMITY ROM     Shoulder to Wrist AROM Right eval Left eval Right  /  Left 06/27/22 Right  /  Left 08/01/22  Forearm supination 68 85 74* 81 Right  Forearm pronation  87 80    Wrist flexion 40 45 43*  /  52* 48 / 50  Wrist extension 33 54 38*  /  55* 48 / 63  (Blank rows = not tested)   UPPER EXTREMITY MMT:     MMT Right 06/27/22  Forearm supination   Forearm pronation   Wrist flexion 5/5  Wrist extension 5/5  Wrist ulnar deviation 5/5  Wrist radial deviation   (Blank rows = not tested)  HAND FUNCTION: 08/01/22: Rt grip: 66.5#, Lt Grip 73.6# doing well b/l, non-painful  OBSERVATIONS:   08/01/22: Not significantly tender to palpation in bilateral hands or thumbs or wrist today no significant swelling or redness present either.  TODAY'S TREATMENT:  08/01/22: He performs gripping and resisted wrist extension and flexion against OT, with little to no pain today.  OT reviews his home exercise program and performs with him: Stretches at the thumb in flexion and abduction, wrist flexion and extension stretches and forearm supination stretches.  He also discusses recent  functional ability and his goals, and he has no significant issues in the past month though being very busy at work and in his social life.  All goals are met adequately today.  OT also discusses with him what to do in case he has a exacerbation of pain from arthritis including: Energy conservation techniques, rest breaks, supportive gloves or wrist strap, he and cool modalities as needed, self massage, stretches, activity modification, etc.  He states understanding these ideas.   PATIENT EDUCATION: Education details: See tx section above for details  Person educated: Patient Education method: Verbal Instruction, Teach  back, Handouts  Education comprehension: States and demonstrates understanding   HOME EXERCISE PROGRAM: Access Code: [email or web address] URL: [email or web address] Date: 06/12/2022 Prepared by: Benito Mccreedy   GOALS: Goals reviewed with patient? Yes   SHORT TERM GOALS: (STG required if POC>30 days) Target Date: 06/30/22  Pt will obtain protective, custom orthotic. Goal status: MET- wrist strap  2.  Pt will demo/state understanding of initial HEP to improve pain levels and prerequisite motion. Goal status: MET 06/27/22   LONG TERM GOALS: Target Date: 07/14/22  Pt will improve functional ability by decreased impairment per PSFS assessment from 4.8 to 7 or better, for better quality of life. Goal status: 08/01/22: Met  2.  Pt will improve A/ROM in Rt wrist flex/ ext  from 40/33 to at least 45/50, to have functional motion for tasks like reach and grasp.  Goal status: 08/01/22: Met  3.  Pt will improve AROM in Rt thumb MCP J flexion from 42* to at least 50* to have increased functional ability to carry out selfcare and higher-level homecare tasks with no difficulty. Goal status: 08/01/22: Much improved,partially met 48* now  4.  Pt will decrease pain at worst from 9/10 with difficult tasks (IADLs) to 3-4/10 or better to have better sleep and occupational  participation in daily roles. Goal status: 08/01/22: MET   ASSESSMENT:  CLINICAL IMPRESSION: 08/01/22: He has little to no pain now or in the past month, he has increased mobility and good grip strength without pain, he has a home exercise program and a plan for when he has to do more harsh yard work activities.  He has a wrist strap to help give him support and was recommended arthritis gloves as needed.  He meets discharge criteria today.   PLAN:  OT FREQUENCY and DURATION: Discharge now  PLANNED INTERVENTIONS: self care/ADL training, therapeutic exercise, therapeutic activity, manual therapy, passive range of motion, splinting, paraffin, compression bandaging, moist heat, cryotherapy, contrast bath, patient/family education, energy conservation, and coping strategies training  CONSULTED AND AGREED WITH PLAN OF CARE: Patient  PLAN FOR NEXT SESSION:  Successful D/C today   Benito Mccreedy, OTR/L, CHT 08/01/2022, 11:10 AM   OCCUPATIONAL THERAPY DISCHARGE SUMMARY  Visits from Start of Care: 3  Current functional level related to goals / functional outcomes: Pt has met all goals to satisfactory levels and is pleased with outcomes.   Remaining deficits: Pt has no more significant functional deficits or pain.   Education / Equipment: Pt has all needed materials and education. Pt understands how to continue on with self-management. See tx notes for more details.   Patient agrees to discharge due to max benefits received from outpatient occupational therapy / hand therapy at this time.   Benito Mccreedy, OTR/L, CHT 08/01/22

## 2022-08-01 ENCOUNTER — Encounter: Payer: Self-pay | Admitting: Rehabilitative and Restorative Service Providers"

## 2022-08-01 ENCOUNTER — Ambulatory Visit: Payer: Medicare PPO | Admitting: Rehabilitative and Restorative Service Providers"

## 2022-08-01 DIAGNOSIS — M25531 Pain in right wrist: Secondary | ICD-10-CM | POA: Diagnosis not present

## 2022-08-01 DIAGNOSIS — M25631 Stiffness of right wrist, not elsewhere classified: Secondary | ICD-10-CM

## 2022-08-01 DIAGNOSIS — M25541 Pain in joints of right hand: Secondary | ICD-10-CM | POA: Diagnosis not present

## 2022-08-03 ENCOUNTER — Other Ambulatory Visit (HOSPITAL_COMMUNITY): Payer: Self-pay

## 2022-08-16 DIAGNOSIS — Z125 Encounter for screening for malignant neoplasm of prostate: Secondary | ICD-10-CM | POA: Diagnosis not present

## 2022-08-16 DIAGNOSIS — Z0000 Encounter for general adult medical examination without abnormal findings: Secondary | ICD-10-CM | POA: Diagnosis not present

## 2022-08-16 DIAGNOSIS — I10 Essential (primary) hypertension: Secondary | ICD-10-CM | POA: Diagnosis not present

## 2022-08-16 DIAGNOSIS — E7800 Pure hypercholesterolemia, unspecified: Secondary | ICD-10-CM | POA: Diagnosis not present

## 2022-08-16 DIAGNOSIS — R202 Paresthesia of skin: Secondary | ICD-10-CM | POA: Diagnosis not present

## 2022-08-16 DIAGNOSIS — G4700 Insomnia, unspecified: Secondary | ICD-10-CM | POA: Diagnosis not present

## 2022-08-16 DIAGNOSIS — A6000 Herpesviral infection of urogenital system, unspecified: Secondary | ICD-10-CM | POA: Diagnosis not present

## 2022-08-16 DIAGNOSIS — I2510 Atherosclerotic heart disease of native coronary artery without angina pectoris: Secondary | ICD-10-CM | POA: Diagnosis not present

## 2022-08-28 DIAGNOSIS — J069 Acute upper respiratory infection, unspecified: Secondary | ICD-10-CM | POA: Diagnosis not present

## 2022-09-01 DIAGNOSIS — J208 Acute bronchitis due to other specified organisms: Secondary | ICD-10-CM | POA: Diagnosis not present

## 2022-09-13 NOTE — Progress Notes (Unsigned)
Cardiology Office Note   Date:  09/14/2022   ID:  Alex Herrera, DOB 08/01/1950, MRN [id number]  PCP:  Maury Dus, MD (Inactive)    No chief complaint on file.  Coronary artery disease  Wt Readings from Last 3 Encounters:  09/14/22 206 lb 12.8 oz (93.8 kg)  07/29/21 209 lb (94.8 kg)  07/26/21 209 lb (94.8 kg)       History of Present Illness: Alex Herrera is a 72 y.o. male   with a hx of CAD who was previously seen by Dr. Wynonia Lawman.   He has a history of CAD (remote PCI to RCA 2015, STEMI 04/2018 s/p DES to LAD x 2 with acute diastolic CHF), HTN, HLD who presents for f/u of CAD. He was previously followed by Dr. Wynonia Lawman. The patient presented to [facility] 04/26/18 with chest pain. Initial EKG nonacute, but repeat EKG with recurrent chest pain noted dramatic ST elevation and code STEMI was called. He unerwent urgent cath with results below.  His LVEDP was elevated and he was treated with IV Lasix x 1. Troponin peaked at 20. Echo 04/27/18 showed preserved LVF with an EF of 55-60% but hypokinesis of mid anteroseptal myocardium; normal diastolic parameters. He was on Livalo and Plavix prior to DC, sent home on Atorvastatin and Brilinta. 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 He has history of myalgias with Crestor. At f/u 05/13/18 BP was elevated so metoprolol was switched to carvedilol. Followup labs otherwise showed normal CBC, K 3.9, Cr 1.12, LDL 71, no recent LFTs on file.    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     Cath in 10/19 showed: Previously placed Prox RCA stent (unknown type) is widely patent. Prox LAD to Mid LAD lesion is 80% stenosed. A drug-eluting stent was successfully placed using a STENT SYNERGY DES 3X38. Post intervention, there is a 0% residual stenosis. Ost LAD to Prox LAD lesion is 99% stenosed. A drug-eluting stent was successfully placed using a STENT SYNERGY DES 3.5X8. Post intervention,  there is a 0% residual stenosis. LV end diastolic pressure is severely elevated. 39 mm Hg. There is no aortic valve stenosis.   Recommend uninterrupted dual antiplatelet therapy with Aspirin 57m daily and Ticagrelor 952mtwice daily for a minimum of 12 months (ACS - Class I recommendation).    He has been on several statins in the past.  He has tried Crestor, pravastatin.  While on atorvastatin, he had similar pain. Tolerating repatha.    Past Medical History:  Diagnosis Date   [place]ute CH[place] heart [place]) [place]   a. presumed diastolic LVEDP 39 during cath 04/2018.   Allergic rhinitis    Arthritis    Coronary artery disease    a. PCI to RCA 2015. b. STEMI 04/2018 s/p DESx2 to LAD, EF 55-60%.   Hearing loss    History of kidney stones    Hyperlipidemia    Hypertension    Skin cancer, basal cell 2000    Past Surgical History:  Procedure Laterality Date   CARDIAC CATHETERIZATION     COLONOSCOPY     CORONARY STENT INTERVENTION N/A 04/26/2018   Procedure: CORONARY STENT INTERVENTION;  Surgeon: VaJettie BoozeMD;  [facility];  Service: Cardiovascular;  Laterality: N/A;   LEFT HEART CATH AND CORONARY ANGIOGRAPHY N/A 04/26/2018   Procedure: LEFT HEART CATH AND CORONARY ANGIOGRAPHY;  Surgeon: VaJettie BoozeMD;  Location: [facility];  Service: Cardiovascular;  Laterality: N/A;   LEFT HEART CATHETERIZATION WITH CORONARY ANGIOGRAM N/A 01/22/2014   Procedure: LEFT HEART CATHETERIZATION WITH CORONARY ANGIOGRAM;  Surgeon: Jacolyn Reedy, MD;  Location: [facility] CATH LAB;  Service: Cardiovascular;  Laterality: N/A;   NASAL SEPTUM SURGERY     1970's   PERCUTANEOUS CORONARY STENT INTERVENTION (PCI-S)  01/22/2014   Procedure: PERCUTANEOUS CORONARY STENT INTERVENTION (PCI-S);  Surgeon: Jacolyn Reedy, MD;  Location: [facility] CATH LAB;  Service: Cardiovascular;;   PTCA  01/22/2014   PTCA/DES 1 PROXIMAL RCA           DR Wynonia Lawman   VASECTOMY       Current  Outpatient Medications  Medication Sig Dispense Refill   acetaminophen (TYLENOL 8 HOUR) 650 MG CR tablet 2 tablets as needed Orally every 8 hrs     albuterol (VENTOLIN HFA) 108 (90 Base) MCG/ACT inhaler SMARTSIG:2 Puff(s) By Mouth Every 6 Hours PRN     Azilsartan Medoxomil 80 MG TABS Take 80 mg by mouth daily.      budesonide-formoterol (SYMBICORT) 160-4.5 MCG/ACT inhaler 2 puffs Inhalation every 12 hours for 5 days     carvedilol (COREG) 6.25 MG tablet TAKE 1 TABLET BY MOUTH TWICE DAILY WITH A MEAL . APPOINTMENT REQUIRED FOR FUTURE REFILLS 180 tablet 3   chlorpheniramine-HYDROcodone (TUSSIONEX) 10-8 MG/5ML SMARTSIG:5 Milliliter(s) By Mouth Every 12 Hours PRN     Coenzyme Q10 (COQ10 PO) Take 1 capsule by mouth daily.     doxycycline (VIBRA-TABS) 100 MG tablet Take 100 mg by mouth 2 (two) times daily.     Evolocumab (REPATHA SURECLICK) [id number] MG/ML SOAJ INJECT 1 PEN INTO THE SKIN EVERY 14 DAYS 6 mL 3   fluticasone (FLONASE) 50 MCG/ACT nasal spray Place 2 sprays into both nostrils daily as needed (seasonal allergies).      hydrochlorothiazide (MICROZIDE) 12.5 MG capsule Take 12.5 mg by mouth daily.     ipratropium (ATROVENT) 0.06 % nasal spray 2 sprays in each nostril Nasally four times a day for 30 days     mometasone (ELOCON) 0.1 % cream Apply 1 application topically daily as needed (wound care).     prasugrel (EFFIENT) 10 MG TABS tablet Take 1 tablet by mouth once daily 90 tablet 3   valACYclovir (VALTREX) 1000 MG tablet Take 1,000 mg by mouth daily.      cetirizine (ZYRTEC ALLERGY) 10 MG tablet 1 tablet Orally Once a day     glucosamine-chondroitin 500-400 MG tablet 2 tablets Orally Once a day     nitroGLYCERIN (NITROSTAT) 0.4 MG SL tablet Place 1 tablet (0.4 mg total) under the tongue every 5 (five) minutes as needed for chest pain. (Patient not taking: Reported on 09/14/2022) 25 tablet 3   No current facility-administered medications for this visit.    Allergies:   Other, Penicillins, Crestor  [rosuvastatin calcium], Amlodipine besylate, and Ticagrelor    Social History:  The patient  reports that he has never smoked. He has never used smokeless tobacco. He reports that he does not drink alcohol and does not use drugs.   Family History:  The patient's family history includes COPD in his father; Diabetes in his mother; Heart attack in his brother; Hypertension in his father and mother.    ROS:  Please see the history of present illness.   Otherwise, review of systems are positive for statin intolerant.   All other systems are reviewed and negative.    PHYSICAL EXAM: VS:  BP (!) 142/76   Pulse 73   Ht 5' 10"$  (1.778 m)   Wt 206 lb 12.8 oz (93.8 kg)   SpO2 95%   BMI 29.67 kg/m  , BMI Body mass index is 29.67 kg/m. GEN: Well nourished, well developed, in no acute distress HEENT: normal Neck: no JVD, carotid bruits, or masses Cardiac: RRR; no murmurs, rubs, or gallops,no edema  Respiratory:  clear to auscultation bilaterally, normal work of breathing GI: soft, nontender, nondistended, + BS MS: no deformity or atrophy Skin: warm and dry, no rash Neuro:  Strength and sensation are intact Psych: euthymic mood, full affect   EKG:   The ekg ordered today demonstrates NSR, no ST changes   Recent Labs: No results found for requested labs within last 365 days.   Lipid Panel    Component Value Date/Time   CHOL 89 (L) 08/01/2021 0724   TRIG 76 08/01/2021 0724   HDL 59 08/01/2021 0724   CHOLHDL 1.5 08/01/2021 0724   CHOLHDL 2.6 04/26/2018 1313   VLDL 18 04/26/2018 1313   LDLCALC 14 08/01/2021 0724     Other studies Reviewed: Additional studies/ records that were reviewed today with results demonstrating: Labs reviewed.   ASSESSMENT AND PLAN:  CAD: Continue clopidogrel monotherapy.  No angina.  Continue aggressive secondary prevention.  Exercise has dropped off after his bronchitis.  He will try to get back tomorrow regular exercise pattern. Hyperlipidemia: Continue  Repatha.  LDL well-controlled.  We spoke about whole food, plant-based diet.  High-fiber diet.  Avoid processed foods. Ischemic cardiomyopathy: No CHF symptoms on exam. Hypertensive heart disease: Continue blood pressure lowering with ARB and beta-blocker.  He is also on low-dose HCTZ.  Activity target as noted below.  This will also help with lowering blood pressure.  Controlled at home, [id number] systolic.     The patient concerns regarding his medicines were addressed.  The following changes have been made:  No change  Labs/ tests ordered today include:  No orders of the defined types were placed in this encounter.   Recommend 150 minutes/week of aerobic exercise Low fat, low carb, high fiber diet recommended  Disposition:   FU in 1 year   Signed, Larae Grooms, MD  09/14/2022 11:44 AM    [facility] [address], [place], [place]  [address] Phone: [phone]; Fax: [phone]

## 2022-09-14 ENCOUNTER — Encounter: Payer: Self-pay | Admitting: Interventional Cardiology

## 2022-09-14 ENCOUNTER — Ambulatory Visit: Payer: Medicare PPO | Attending: Interventional Cardiology | Admitting: Interventional Cardiology

## 2022-09-14 VITALS — BP 142/76 | HR 73 | Ht 70.0 in | Wt 206.8 lb

## 2022-09-14 DIAGNOSIS — T466X5D Adverse effect of antihyperlipidemic and antiarteriosclerotic drugs, subsequent encounter: Secondary | ICD-10-CM | POA: Diagnosis not present

## 2022-09-14 DIAGNOSIS — G720 Drug-induced myopathy: Secondary | ICD-10-CM | POA: Diagnosis not present

## 2022-09-14 DIAGNOSIS — I25118 Atherosclerotic heart disease of native coronary artery with other forms of angina pectoris: Secondary | ICD-10-CM | POA: Diagnosis not present

## 2022-09-14 DIAGNOSIS — I10 Essential (primary) hypertension: Secondary | ICD-10-CM

## 2022-09-14 DIAGNOSIS — E785 Hyperlipidemia, unspecified: Secondary | ICD-10-CM | POA: Diagnosis not present

## 2022-09-14 MED ORDER — NITROGLYCERIN 0.4 MG SL SUBL: 0.4000 mg | SUBLINGUAL_TABLET | SUBLINGUAL | 3 refills | Status: AC | PRN

## 2022-09-14 NOTE — Patient Instructions (Signed)
Medication Instructions:  Your physician recommends that you continue on your current medications as directed. Please refer to the Current Medication list given to you today.  *If you need a refill on your cardiac medications before your next appointment, please call your pharmacy*  Follow-Up: At [facility], you and your health needs are our priority.  As part of our continuing mission to provide you with exceptional heart care, we have created designated Provider Care Teams.  These Care Teams include your primary Cardiologist (physician) and Advanced Practice Providers (APPs -  Physician Assistants and Nurse Practitioners) who all work together to provide you with the care you need, when you need it.  Your next appointment:   1 year(s)  Provider:   Larae Grooms, MD

## 2022-09-15 NOTE — Addendum Note (Signed)
Addended by: Maryan Rued on: 09/15/2022 09:44 AM   Modules accepted: Orders

## 2022-09-16 ENCOUNTER — Other Ambulatory Visit: Payer: Self-pay | Admitting: Interventional Cardiology

## 2022-09-16 DIAGNOSIS — I2102 ST elevation (STEMI) myocardial infarction involving left anterior descending coronary artery: Secondary | ICD-10-CM

## 2022-09-30 ENCOUNTER — Other Ambulatory Visit: Payer: Self-pay | Admitting: Interventional Cardiology

## 2022-10-11 DIAGNOSIS — H43813 Vitreous degeneration, bilateral: Secondary | ICD-10-CM | POA: Diagnosis not present

## 2022-10-31 DIAGNOSIS — L570 Actinic keratosis: Secondary | ICD-10-CM | POA: Diagnosis not present

## 2022-10-31 DIAGNOSIS — L723 Sebaceous cyst: Secondary | ICD-10-CM | POA: Diagnosis not present

## 2022-10-31 DIAGNOSIS — L821 Other seborrheic keratosis: Secondary | ICD-10-CM | POA: Diagnosis not present

## 2022-10-31 DIAGNOSIS — L814 Other melanin hyperpigmentation: Secondary | ICD-10-CM | POA: Diagnosis not present

## 2022-10-31 DIAGNOSIS — L918 Other hypertrophic disorders of the skin: Secondary | ICD-10-CM | POA: Diagnosis not present

## 2022-10-31 DIAGNOSIS — L720 Epidermal cyst: Secondary | ICD-10-CM | POA: Diagnosis not present

## 2022-11-20 DIAGNOSIS — J069 Acute upper respiratory infection, unspecified: Secondary | ICD-10-CM | POA: Diagnosis not present

## 2022-11-20 DIAGNOSIS — R062 Wheezing: Secondary | ICD-10-CM | POA: Diagnosis not present

## 2022-11-23 DIAGNOSIS — J22 Unspecified acute lower respiratory infection: Secondary | ICD-10-CM | POA: Diagnosis not present

## 2022-11-30 DIAGNOSIS — J209 Acute bronchitis, unspecified: Secondary | ICD-10-CM | POA: Diagnosis not present

## 2022-11-30 DIAGNOSIS — J302 Other seasonal allergic rhinitis: Secondary | ICD-10-CM | POA: Diagnosis not present

## 2022-11-30 DIAGNOSIS — R062 Wheezing: Secondary | ICD-10-CM | POA: Diagnosis not present

## 2023-02-14 DIAGNOSIS — A6000 Herpesviral infection of urogenital system, unspecified: Secondary | ICD-10-CM | POA: Diagnosis not present

## 2023-02-14 DIAGNOSIS — I10 Essential (primary) hypertension: Secondary | ICD-10-CM | POA: Diagnosis not present

## 2023-02-14 DIAGNOSIS — I2510 Atherosclerotic heart disease of native coronary artery without angina pectoris: Secondary | ICD-10-CM | POA: Diagnosis not present

## 2023-02-14 DIAGNOSIS — E7800 Pure hypercholesterolemia, unspecified: Secondary | ICD-10-CM | POA: Diagnosis not present

## 2023-02-14 DIAGNOSIS — J302 Other seasonal allergic rhinitis: Secondary | ICD-10-CM | POA: Diagnosis not present

## 2023-02-14 DIAGNOSIS — G4700 Insomnia, unspecified: Secondary | ICD-10-CM | POA: Diagnosis not present

## 2023-02-14 DIAGNOSIS — M797 Fibromyalgia: Secondary | ICD-10-CM | POA: Diagnosis not present

## 2023-02-19 ENCOUNTER — Other Ambulatory Visit: Payer: Self-pay | Admitting: Interventional Cardiology

## 2023-03-23 DIAGNOSIS — U071 COVID-19: Secondary | ICD-10-CM | POA: Diagnosis not present

## 2023-07-04 DIAGNOSIS — B009 Herpesviral infection, unspecified: Secondary | ICD-10-CM | POA: Diagnosis not present

## 2023-07-04 DIAGNOSIS — Z8249 Family history of ischemic heart disease and other diseases of the circulatory system: Secondary | ICD-10-CM | POA: Diagnosis not present

## 2023-07-04 DIAGNOSIS — E669 Obesity, unspecified: Secondary | ICD-10-CM | POA: Diagnosis not present

## 2023-07-04 DIAGNOSIS — G720 Drug-induced myopathy: Secondary | ICD-10-CM | POA: Diagnosis not present

## 2023-07-04 DIAGNOSIS — T466X5S Adverse effect of antihyperlipidemic and antiarteriosclerotic drugs, sequela: Secondary | ICD-10-CM | POA: Diagnosis not present

## 2023-07-04 DIAGNOSIS — G629 Polyneuropathy, unspecified: Secondary | ICD-10-CM | POA: Diagnosis not present

## 2023-07-04 DIAGNOSIS — J302 Other seasonal allergic rhinitis: Secondary | ICD-10-CM | POA: Diagnosis not present

## 2023-07-04 DIAGNOSIS — I10 Essential (primary) hypertension: Secondary | ICD-10-CM | POA: Diagnosis not present

## 2023-07-04 DIAGNOSIS — E785 Hyperlipidemia, unspecified: Secondary | ICD-10-CM | POA: Diagnosis not present

## 2023-07-04 DIAGNOSIS — Z833 Family history of diabetes mellitus: Secondary | ICD-10-CM | POA: Diagnosis not present

## 2023-07-04 DIAGNOSIS — Z6830 Body mass index (BMI) 30.0-30.9, adult: Secondary | ICD-10-CM | POA: Diagnosis not present

## 2023-07-04 DIAGNOSIS — I2510 Atherosclerotic heart disease of native coronary artery without angina pectoris: Secondary | ICD-10-CM | POA: Diagnosis not present

## 2023-07-04 DIAGNOSIS — I252 Old myocardial infarction: Secondary | ICD-10-CM | POA: Diagnosis not present

## 2023-07-04 DIAGNOSIS — M1990 Unspecified osteoarthritis, unspecified site: Secondary | ICD-10-CM | POA: Diagnosis not present

## 2023-07-04 DIAGNOSIS — Z974 Presence of external hearing-aid: Secondary | ICD-10-CM | POA: Diagnosis not present

## 2023-07-27 DIAGNOSIS — R0981 Nasal congestion: Secondary | ICD-10-CM | POA: Diagnosis not present

## 2023-07-27 DIAGNOSIS — J019 Acute sinusitis, unspecified: Secondary | ICD-10-CM | POA: Diagnosis not present

## 2023-07-27 DIAGNOSIS — R051 Acute cough: Secondary | ICD-10-CM | POA: Diagnosis not present

## 2023-08-20 DIAGNOSIS — I2510 Atherosclerotic heart disease of native coronary artery without angina pectoris: Secondary | ICD-10-CM | POA: Diagnosis not present

## 2023-08-20 DIAGNOSIS — Z1389 Encounter for screening for other disorder: Secondary | ICD-10-CM | POA: Diagnosis not present

## 2023-08-20 DIAGNOSIS — R202 Paresthesia of skin: Secondary | ICD-10-CM | POA: Diagnosis not present

## 2023-08-20 DIAGNOSIS — I10 Essential (primary) hypertension: Secondary | ICD-10-CM | POA: Diagnosis not present

## 2023-08-20 DIAGNOSIS — A6000 Herpesviral infection of urogenital system, unspecified: Secondary | ICD-10-CM | POA: Diagnosis not present

## 2023-08-20 DIAGNOSIS — E7800 Pure hypercholesterolemia, unspecified: Secondary | ICD-10-CM | POA: Diagnosis not present

## 2023-08-20 DIAGNOSIS — L309 Dermatitis, unspecified: Secondary | ICD-10-CM | POA: Diagnosis not present

## 2023-08-20 DIAGNOSIS — G4700 Insomnia, unspecified: Secondary | ICD-10-CM | POA: Diagnosis not present

## 2023-08-20 DIAGNOSIS — Z0000 Encounter for general adult medical examination without abnormal findings: Secondary | ICD-10-CM | POA: Diagnosis not present

## 2023-08-23 DIAGNOSIS — R7301 Impaired fasting glucose: Secondary | ICD-10-CM | POA: Diagnosis not present

## 2023-09-16 ENCOUNTER — Encounter: Payer: Self-pay | Admitting: Physician Assistant

## 2023-09-16 NOTE — Progress Notes (Signed)
 Cardiology Office Note:    Date:  09/17/2023  ID:  Alex Herrera, DOB [birth year], MRN [id number] PCP: Elias Else, MD  [facility] Providers Cardiologist:  Orbie Pyo, MD       Patient Profile:      Coronary artery disease  S/p 2.5 x 15 mm DES to RCA in 01/2014 Ant STEMI 04/2018 s/p overlapping 3.5 x 8 mm and 3 x 38 mm DES to LAD  LHC RCA proximal stent patent; LAD ostial 99, proximal 80 TTE 04/27/2018: EF 55-60, anteroseptal HK, trivial TR, trivial MR Myoview 07/29/2021: No ischemia or infarction, EF 59, low risk Hyperlipidemia Hypertension        Discussed the use of AI scribe software for clinical note transcription with the patient, who gave verbal consent to proceed. History of Present Illness Alex Herrera "Ed" is a 73 year old male with coronary artery disease who presents for an annual follow-up visit.  He is here alone. He is currently asymptomatic with no anginal symptoms. He experiences a persistent cough and respiratory symptoms, which he attributes to frequent colds from his grandchildren. Shortness of breath occurs primarily during these colds. He uses a humidifier at night, takes Zyrtec daily, and uses a nasal spray and Symbicort inhaler at night for wheezing. He has not exercised for three to four months due to these symptoms. No chest pain, pressure, or symptoms similar to his previous heart attack. He reports a persistent cough with green sputum but no blood. He has been evaluated by primary care for this problem. He does not smoke but occasionally uses marijuana to aid sleep. He also takes magnesium supplements at night for sleep. He experiences muscle aches, particularly at night, which affect his sleep.  Review of Systems  Respiratory:  Negative for hemoptysis.   -See HPI     Studies Reviewed:   EKG Interpretation Date/Time:  Monday September 17 2023 09:07:54 EST Ventricular Rate:  60 PR Interval:  178 QRS Duration:  84 QT  Interval:  418 QTC Calculation: 418 R Axis:   22  Text Interpretation: Normal sinus rhythm Normal ECG No significant change since last tracing Confirmed by Tereso Newcomer [phone]) on 09/17/2023 9:27:50 AM   Results LABS - Per [facility] Review  Total cholesterol: 91 mg/dL (56/21/3086) HDL: 57 mg/dL (57/84/6962) LDL: 18 mg/dL (95/28/4132) Triglyceride: 72 mg/dL (44/07/270) ALT: 23 U/L (08/20/2023) A1c: 6.1% (07/2023)   Risk Assessment/Calculations:             Physical Exam:   VS:  BP 126/80   Pulse 60   Ht 5\' 10"  (1.778 m)   Wt 212 lb (96.2 kg)   SpO2 96%   BMI 30.42 kg/m    Wt Readings from Last 3 Encounters:  09/17/23 212 lb (96.2 kg)  09/14/22 206 lb 12.8 oz (93.8 kg)  07/29/21 209 lb (94.8 kg)    Constitutional:      Appearance: Healthy appearance. Not in distress.  Neck:     Vascular: No carotid bruit. JVD normal.  Pulmonary:     Breath sounds: Normal breath sounds. No wheezing. No rales.  Cardiovascular:     Normal rate. Regular rhythm.     Murmurs: There is no murmur.  Edema:    Peripheral edema absent.  Abdominal:     Palpations: Abdomen is soft.      Assessment and Plan:   Assessment & Plan Coronary artery disease involving native coronary artery of native heart without angina pectoris Status post DES to  RCA in July 2015 and anterior STEMI in October 2019 treated with DES x2 to the LAD. RCA stent patent was patent at that time. Nuclear stress test in January 2023 was low risk with EF 59%. No current anginal symptoms. -Continue prasugrel 10mg  daily, Repatha 140mg  every two weeks, and nitroglycerin PRN. Essential hypertension Blood pressure controlled. -Continue Azilsartan 80mg  daily, carvedilol 6.25mg  twice daily, and HCTZ 12.5mg  daily. Dyslipidemia, goal LDL below 70 LDL optimal. -Continue Repatha 140mg  every two weeks. Chronic cough Ongoing symptoms despite treatment. -Continue follow-up with primary care for further evaluation and management.       Dispo:  Return in about 1 year (around 09/16/2024) for Routine Follow Up, w/ Dr. Lynnette Caffey, or Tereso Newcomer, PA-C.  Signed, Tereso Newcomer, PA-C

## 2023-09-17 ENCOUNTER — Ambulatory Visit: Payer: Medicare PPO | Attending: Physician Assistant | Admitting: Physician Assistant

## 2023-09-17 ENCOUNTER — Encounter: Payer: Self-pay | Admitting: Physician Assistant

## 2023-09-17 VITALS — BP 126/80 | HR 60 | Ht 70.0 in | Wt 212.0 lb

## 2023-09-17 DIAGNOSIS — I2510 Atherosclerotic heart disease of native coronary artery without angina pectoris: Secondary | ICD-10-CM | POA: Diagnosis not present

## 2023-09-17 DIAGNOSIS — E785 Hyperlipidemia, unspecified: Secondary | ICD-10-CM | POA: Diagnosis not present

## 2023-09-17 DIAGNOSIS — R053 Chronic cough: Secondary | ICD-10-CM

## 2023-09-17 DIAGNOSIS — I10 Essential (primary) hypertension: Secondary | ICD-10-CM

## 2023-09-17 NOTE — Patient Instructions (Signed)
 Medication Instructions:  Your physician recommends that you continue on your current medications as directed. Please refer to the Current Medication list given to you today.  *If you need a refill on your cardiac medications before your next appointment, please call your pharmacy*   Lab Work: None ordered  If you have labs (blood work) drawn today and your tests are completely normal, you will receive your results only by: MyChart Message (if you have MyChart) OR A paper copy in the mail If you have any lab test that is abnormal or we need to change your treatment, we will call you to review the results.   Testing/Procedures: None ordered   Follow-Up: At [facility], you and your health needs are our priority.  As part of our continuing mission to provide you with exceptional heart care, we have created designated Provider Care Teams.  These Care Teams include your primary Cardiologist (physician) and Advanced Practice Providers (APPs -  Physician Assistants and Nurse Practitioners) who all work together to provide you with the care you need, when you need it.  We recommend signing up for the patient portal called "MyChart".  Sign up information is provided on this After Visit Summary.  MyChart is used to connect with patients for Virtual Visits (Telemedicine).  Patients are able to view lab/test results, encounter notes, upcoming appointments, etc.  Non-urgent messages can be sent to your provider as well.   To learn more about what you can do with MyChart, go to [email or web address].    Your next appointment:   12 month(s)  Provider:   Orbie Pyo, MD  or Tereso Newcomer, PA-C      y    Other Instructions      1st Floor: - Lobby - Registration  - Pharmacy  - Lab - Cafe  2nd Floor: - PV Lab - Diagnostic Testing (echo, CT, nuclear med)  3rd Floor: - Vacant  4th Floor: - TCTS (cardiothoracic surgery) - AFib Clinic - Structural Heart Clinic -  Vascular Surgery  - Vascular Ultrasound  5th Floor: - [facility] (general and EP) - Clinical Pharmacy for coumadin, hypertension, lipid, weight-loss medications, and med management appointments    Valet parking services will be available as well.

## 2023-09-17 NOTE — Assessment & Plan Note (Signed)
 Status post DES to RCA in July 2015 and anterior STEMI in October 2019 treated with DES x2 to the LAD. RCA stent patent was patent at that time. Nuclear stress test in January 2023 was low risk with EF 59%. No current anginal symptoms. -Continue prasugrel 10mg  daily, Repatha 140mg  every two weeks, and nitroglycerin PRN.

## 2023-09-17 NOTE — Assessment & Plan Note (Signed)
 LDL optimal. -Continue Repatha 140mg  every two weeks.

## 2023-09-17 NOTE — Assessment & Plan Note (Signed)
 Blood pressure controlled. -Continue Azilsartan 80mg  daily, carvedilol 6.25mg  twice daily, and HCTZ 12.5mg  daily.

## 2024-01-09 DIAGNOSIS — L570 Actinic keratosis: Secondary | ICD-10-CM | POA: Diagnosis not present

## 2024-01-09 DIAGNOSIS — L720 Epidermal cyst: Secondary | ICD-10-CM | POA: Diagnosis not present

## 2024-01-09 DIAGNOSIS — L821 Other seborrheic keratosis: Secondary | ICD-10-CM | POA: Diagnosis not present

## 2024-01-09 DIAGNOSIS — D225 Melanocytic nevi of trunk: Secondary | ICD-10-CM | POA: Diagnosis not present

## 2024-01-09 DIAGNOSIS — L565 Disseminated superficial actinic porokeratosis (DSAP): Secondary | ICD-10-CM | POA: Diagnosis not present

## 2024-01-31 ENCOUNTER — Other Ambulatory Visit: Payer: Self-pay | Admitting: Interventional Cardiology

## 2024-01-31 DIAGNOSIS — E785 Hyperlipidemia, unspecified: Secondary | ICD-10-CM

## 2024-01-31 DIAGNOSIS — I2510 Atherosclerotic heart disease of native coronary artery without angina pectoris: Secondary | ICD-10-CM

## 2024-02-18 DIAGNOSIS — Z7184 Encounter for health counseling related to travel: Secondary | ICD-10-CM | POA: Diagnosis not present

## 2024-02-18 DIAGNOSIS — E7800 Pure hypercholesterolemia, unspecified: Secondary | ICD-10-CM | POA: Diagnosis not present

## 2024-02-18 DIAGNOSIS — A6000 Herpesviral infection of urogenital system, unspecified: Secondary | ICD-10-CM | POA: Diagnosis not present

## 2024-02-18 DIAGNOSIS — I10 Essential (primary) hypertension: Secondary | ICD-10-CM | POA: Diagnosis not present

## 2024-02-18 DIAGNOSIS — G4700 Insomnia, unspecified: Secondary | ICD-10-CM | POA: Diagnosis not present

## 2024-02-18 DIAGNOSIS — R202 Paresthesia of skin: Secondary | ICD-10-CM | POA: Diagnosis not present

## 2024-02-18 DIAGNOSIS — I2510 Atherosclerotic heart disease of native coronary artery without angina pectoris: Secondary | ICD-10-CM | POA: Diagnosis not present

## 2024-02-18 DIAGNOSIS — L309 Dermatitis, unspecified: Secondary | ICD-10-CM | POA: Diagnosis not present

## 2024-02-18 DIAGNOSIS — J302 Other seasonal allergic rhinitis: Secondary | ICD-10-CM | POA: Diagnosis not present

## 2024-04-24 ENCOUNTER — Other Ambulatory Visit: Payer: Self-pay | Admitting: Physician Assistant

## 2024-04-24 DIAGNOSIS — I2510 Atherosclerotic heart disease of native coronary artery without angina pectoris: Secondary | ICD-10-CM

## 2024-04-24 DIAGNOSIS — E785 Hyperlipidemia, unspecified: Secondary | ICD-10-CM

## 2024-05-07 DIAGNOSIS — J4540 Moderate persistent asthma, uncomplicated: Secondary | ICD-10-CM | POA: Diagnosis not present

## 2024-07-19 ENCOUNTER — Other Ambulatory Visit: Payer: Self-pay | Admitting: Physician Assistant

## 2024-07-19 DIAGNOSIS — I2510 Atherosclerotic heart disease of native coronary artery without angina pectoris: Secondary | ICD-10-CM

## 2024-07-19 DIAGNOSIS — E785 Hyperlipidemia, unspecified: Secondary | ICD-10-CM

## 2024-08-27 ENCOUNTER — Ambulatory Visit: Admitting: Internal Medicine

## 2025-01-13 ENCOUNTER — Ambulatory Visit: Admitting: Internal Medicine
